# Patient Record
Sex: Male | Born: 1960 | Hispanic: No | Smoking: Never smoker
Health system: Southern US, Community
[De-identification: ages and names within clinical notes are randomized; demographics above are authoritative.]

## PROBLEM LIST (undated history)

## (undated) DIAGNOSIS — G2581 Restless legs syndrome: Secondary | ICD-10-CM

## (undated) DIAGNOSIS — Z973 Presence of spectacles and contact lenses: Secondary | ICD-10-CM

## (undated) DIAGNOSIS — H9312 Tinnitus, left ear: Secondary | ICD-10-CM

## (undated) DIAGNOSIS — G4730 Sleep apnea, unspecified: Secondary | ICD-10-CM

## (undated) HISTORY — PX: COLONOSCOPY: SHX174

## (undated) HISTORY — PX: SHOULDER ARTHROSCOPY: SHX128

## (undated) HISTORY — PX: FEMUR SURGERY: SHX943

---

## 2001-08-08 ENCOUNTER — Ambulatory Visit (HOSPITAL_COMMUNITY): Admission: RE | Admit: 2001-08-08 | Discharge: 2001-08-08 | Payer: Self-pay | Admitting: Endocrinology

## 2001-08-08 ENCOUNTER — Encounter: Payer: Self-pay | Admitting: Endocrinology

## 2006-02-17 ENCOUNTER — Ambulatory Visit (HOSPITAL_COMMUNITY): Admission: RE | Admit: 2006-02-17 | Discharge: 2006-02-17 | Payer: Self-pay | Admitting: Orthopedic Surgery

## 2009-05-31 ENCOUNTER — Emergency Department (HOSPITAL_COMMUNITY): Admission: EM | Admit: 2009-05-31 | Discharge: 2009-05-31 | Payer: Self-pay | Admitting: Emergency Medicine

## 2011-02-14 LAB — BASIC METABOLIC PANEL
BUN: 10 mg/dL (ref 6–23)
CO2: 29 mEq/L (ref 19–32)
GFR calc non Af Amer: >60 mL/min (ref >60)
Glucose, Bld: 100 mg/dL — ABNORMAL HIGH (ref 70–99)
Potassium: 3.4 mEq/L — ABNORMAL LOW (ref 3.5–5.1)

## 2011-02-14 LAB — CBC
HCT: 44.4 % (ref 39.0–52.0)
Hemoglobin: 14.7 g/dL (ref 13.0–17.0)
MCHC: 33 g/dL (ref 30.0–36.0)
MCV: 94.3 fL (ref 78.0–100.0)
Platelets: 204 10*3/uL (ref 150–400)
RDW: 13.4 % (ref 11.5–15.5)

## 2011-02-14 LAB — DIFFERENTIAL
Eosinophils Absolute: 0.1 10*3/uL (ref 0.0–0.7)
Eosinophils Relative: 2 % (ref 0–5)
Lymphocytes Relative: 22 % (ref 12–46)
Lymphs Abs: 1.2 10*3/uL (ref 0.7–4.0)
Monocytes Absolute: 0.5 10*3/uL (ref 0.1–1.0)
Monocytes Relative: 9 % (ref 3–12)

## 2011-03-26 NOTE — Op Note (Signed)
NAMEHARVARD, Clifford Armstrong             ACCOUNT NO.:  [id number]   MEDICAL RECORD NO.:  [id number]          PATIENT TYPE:  OIB   LOCATION:  0098                         FACILITY:  [facility]   PHYSICIAN:  Marlowe Kays, M.D.  DATE OF BIRTH:  1961/09/02   DATE OF PROCEDURE:  02/17/2006  DATE OF DISCHARGE:                                 OPERATIVE REPORT   PREOPERATIVE DIAGNOSES:  1.  Rotator cuff tendinopathy secondary to chronic impingement syndrome.  2.  Degenerative arthritis acromioclavicular joint left shoulder.   POSTOPERATIVE DIAGNOSES:  1.  Rotator cuff tendinopathy secondary to chronic impingement syndrome.  2.  Degenerative arthritis acromioclavicular joint left shoulder.   OPERATION:  1.  Left shoulder arthroscopy (essentially normal examination).  2.  Arthroscopic subacromial decompression.  3.  Open resection of distal clavicle.   SURGEON:  [place].   ASSISTANT:  Mr. Adrian Blackwater.   ANESTHESIA:  General.   INDICATIONS FOR PROCEDURE:  Chronic pain, left shoulder with no significant  long-term relief from injections.  An MRI has demonstrated the above  diagnoses.  Accordingly because of the pain and lack of ability to respond  to the nonsurgical treatments, he is here for the above surgery.   PROCEDURE:  After satisfactory general anesthesia in beach-chair position on  the Allen frame, left shoulder girdle was prepped with a DuraPrep, draped  into a sterile field.  The anatomy of the shoulder joint was marked out.  The lateral and posterior soft spot portals, subacromial space and the  proposed incision for the distal clavicle excision were all infiltrated with  0.5% Marcaine with adrenalin.  Through a posterior soft spot portal, I  atraumatically entered the glenohumeral joint.  He did have some synovitis  present.  Biceps tendon and rotator cuff looked normal.  There was some  fraying of the labrum, but there did not appear to be any significant  tearing or  anything that required arthroscopic treatment.  Representative  pictures were taken.  I then redirected the scope into the subacromial space  and through a lateral portal introduced a 4.2 shaver, with a large amount of  bursal tissue which I resected to allow visualization.  This was quite  friable.  I then used the ArthroCare vaporizer to cauterize some of the  active blood vessels and also began removing soft tissue from the  undersurface of the acromion.  He was quite tight and even with Mr.  Idolina Primer pulling down on the arm, decompression I had to take in stages.  The next step was to bring in the 4-mm oval bur and then began burring down  bone.  I went back and forth between the vaporizer and the bur until we had  a wide subacromial decompression documented with pictures.  Then removed all  fluid possible from the subacromial space and made an open incision on the  distal clavicle, identifying the [facility] joint and measuring a 1.5 cm proximal to  it where I marked the bone.  I then undermined at that level and placed a  baby Hohmann.  Next, I used a micro saw to amputate the  bone at this point  and removed the cut portion with towel clip and cautery technique.  Bleeders  were coagulated in the resection bed, and after making sure that there were  no remaining bony spicules I placed bone wax over the cut end of the  clavicle, packed the gap with Gelfoam and then closed this wound with  interrupted 0 Vicryl in the fascia, 2-0 Vicryl in subcutaneous tissues and  Steri-Strips on the skin.  We also closed the two portals with 4-0 nylon  followed by Betadine, Adaptic and dry sterile dressings to all wounds  followed by a shoulder immobilizer.  At the time of this dictation, he was  on his way to the recovery room in satisfactory condition with no known  complications.           ______________________________  Marlowe Kays, M.D.     JA/MEDQ  D:  02/17/2006  T:  02/17/2006  Job:   [id number]

## 2013-03-09 ENCOUNTER — Other Ambulatory Visit: Payer: Self-pay | Admitting: Endocrinology

## 2013-03-09 DIAGNOSIS — R109 Unspecified abdominal pain: Secondary | ICD-10-CM

## 2013-03-13 ENCOUNTER — Ambulatory Visit
Admission: RE | Admit: 2013-03-13 | Discharge: 2013-03-13 | Disposition: A | Discharge status: Home / Self Care | Payer: BC Managed Care – PPO | Source: Ambulatory Visit | Attending: Endocrinology | Admitting: Endocrinology

## 2013-03-13 DIAGNOSIS — R109 Unspecified abdominal pain: Secondary | ICD-10-CM

## 2013-03-13 MED ORDER — IOHEXOL 300 MG/ML  SOLN
100.0000 mL | Freq: Once | INTRAMUSCULAR | Status: AC | PRN
  Administered 2013-03-13: 100 mL via INTRAVENOUS

## 2016-09-06 DIAGNOSIS — H903 Sensorineural hearing loss, bilateral: Secondary | ICD-10-CM | POA: Insufficient documentation

## 2016-09-06 DIAGNOSIS — H9312 Tinnitus, left ear: Secondary | ICD-10-CM | POA: Insufficient documentation

## 2016-09-07 ENCOUNTER — Ambulatory Visit
Admission: RE | Admit: 2016-09-07 | Discharge: 2016-09-07 | Disposition: A | Discharge status: Home / Self Care | Payer: 59 | Source: Ambulatory Visit | Attending: Orthopedic Surgery | Admitting: Orthopedic Surgery

## 2016-09-07 ENCOUNTER — Other Ambulatory Visit: Payer: Self-pay | Admitting: Orthopedic Surgery

## 2016-09-07 DIAGNOSIS — S62212A Bennett's fracture, left hand, initial encounter for closed fracture: Secondary | ICD-10-CM

## 2016-09-08 ENCOUNTER — Other Ambulatory Visit: Payer: Self-pay | Admitting: Orthopedic Surgery

## 2020-11-13 NOTE — Progress Notes (Signed)
Subjective:    I'm seeing this patient as a consultation for:  Dr. Renne Crigler. Note will be routed back to referring provider/PCP.  CC: R-sided back pain  I, Christoper Fabian, LAT, ATC, am serving as scribe for Dr. Clementeen Graham.  HPI: Pt is a 60 y/o male presenting w/ c/o R-sided thoracic back pain x approximately one week that began after coughing for a few weeks.  Last Wednesday he started to have increased pain noted w/ coughing and while trying to work out.  He locates his pain specifically to the R side of his T-spine.  He went to Urgent Care on Sunday and was prescribed prednisone and baclofen  Radiating pain: No Aggravating factors: coughing; trunk flexion unsupported; raising R arm above shoulder level; transitioning from sit-to-stand; walking upstairs Treatments tried: Prednisone; baclofen; hydrocodone; massage  Diagnostic imaging: T-spine XR- 11/13/20  Past medical history, Surgical history, Family history, Social history, Allergies, and medications have been entered into the medical record, reviewed.   Review of Systems: No new headache, visual changes, nausea, vomiting, diarrhea, constipation, dizziness, abdominal pain, skin rash, fevers, chills, night sweats, weight loss, swollen lymph nodes, body aches, joint swelling, muscle aches, chest pain, shortness of breath, mood changes, visual or auditory hallucinations.   Objective:    Vitals:   11/14/20 0912  BP: (!) 148/90  Pulse: 83  SpO2: 98%   General: Well Developed, well nourished, and in no acute distress.  Neuro/Psych: Alert and oriented x3, extra-ocular muscles intact, able to move all 4 extremities, sensation grossly intact. Skin: Warm and dry, no rashes noted.  Respiratory: Not using accessory muscles, speaking in full sentences, trachea midline.  Lungs clear to auscultation bilaterally chest expansion symmetrical. Cardiovascular: Pulses palpable, no extremity edema. Abdomen: Does not appear distended. MSK: T-spine  normal-appearing nontender midline. Not particularly tender to palpation perispinal musculature. Scapular motion right-sided slight delayed compared to left. Arm motion intact bilaterally.   Lab and Radiology Results   EXAM: THORACIC SPINE 2 VIEWS  COMPARISON: None.  FINDINGS: Evaluation of the superior aspect of the thoracic spine as well as the cervicothoracic junction is degraded secondary overlying osseous and soft tissue structures.  Normal alignment of the thoracic spine. No significant scoliotic curvature. No anterolisthesis or retrolisthesis.  Thoracic vertebral body heights appear preserved.  Thoracic intervertebral disc space heights appear preserved. 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  Limited visualization of the adjacent thorax demonstrates mild tortuosity of the thoracic aorta, potentially accentuated due to technique. Regional soft tissues appear normal  IMPRESSION: 1. No explanation for patient's right-sided thoracic spine pain. 2. Stigmata of dish within midthoracic spine.   Electronically Signed By: Simonne Come M.D. On: 11/13/2020 16:07  I, Clementeen Graham, personally (independently) visualized and performed the interpretation of the images attached in this note.  Chest x-ray images obtained today personally and independently interpreted Normal 2 view chest x-ray.  Mild T-spine degenerative changes as noted above.  No infiltrates. Await formal radiology review  Impression and Recommendations:    Assessment and Plan: 60 y.o. male with right-sided thoracic back pain/chest pain.  Pain occurred with cough.  Could represent intercostal muscle spasm and dysfunction or periscapular spasm and dysfunction.  Also could be pleuritis or even rib subluxation.  Patient has not improved with 2 courses of prednisone and baclofen.  I do not think more prednisone is going to help here.  We will try switching of the muscle relaxer to tizanidine although I am not  very optimistic about that.  Will refer to physical therapy which ultimately think will be the most helpful treatment.  He may have some benefit with chiropractic care as well.  Recommend also heating pad and TENS unit.  If cough returns can prescribe Tessalon Perles or even opiates.  If not improving or if worsening could consider CT scan of the chest.  Check back as needed.Marland Kitchen  PDMP not reviewed this encounter. Orders Placed This Encounter  Procedures  . DG Chest 2 View    Standing Status:   Future    Number of Occurrences:   1    Standing Expiration Date:   11/14/2021    Order Specific Question:   Reason for Exam (SYMPTOM  OR DIAGNOSIS REQUIRED)    Answer:   eval ? pleuritis rt    Order Specific Question:   Preferred imaging location?    Answer:   [facility]  . Ambulatory referral to Physical Therapy    Referral Priority:   Routine    Referral Type:   Physical Medicine    Referral Reason:   Specialty Services Required    Requested Specialty:   Physical Therapy   Meds ordered this encounter  Medications  . tiZANidine (ZANAFLEX) 4 MG tablet    Sig: Take 1 tablet (4 mg total) by mouth every 6 (six) hours as needed for muscle spasms.    Dispense:  30 tablet    Refill:  1    Discussed warning signs or symptoms. Please see discharge instructions. Patient expresses understanding.   The above documentation has been reviewed and is accurate and complete Clementeen Graham, M.D.

## 2020-11-14 ENCOUNTER — Ambulatory Visit (INDEPENDENT_AMBULATORY_CARE_PROVIDER_SITE_OTHER): Payer: 59 | Admitting: Family Medicine

## 2020-11-14 ENCOUNTER — Encounter: Payer: Self-pay | Admitting: Family Medicine

## 2020-11-14 ENCOUNTER — Ambulatory Visit (INDEPENDENT_AMBULATORY_CARE_PROVIDER_SITE_OTHER): Payer: 59

## 2020-11-14 ENCOUNTER — Telehealth: Payer: Self-pay | Admitting: Family Medicine

## 2020-11-14 ENCOUNTER — Other Ambulatory Visit: Payer: Self-pay

## 2020-11-14 VITALS — BP 148/90 | HR 83 | Ht 72.0 in | Wt 160.4 lb

## 2020-11-14 DIAGNOSIS — M546 Pain in thoracic spine: Secondary | ICD-10-CM

## 2020-11-14 DIAGNOSIS — R091 Pleurisy: Secondary | ICD-10-CM

## 2020-11-14 MED ORDER — TIZANIDINE HCL 4 MG PO TABS: 4.0000 mg | ORAL_TABLET | Freq: Four times a day (QID) | ORAL | 1 refills | Status: DC | PRN

## 2020-11-14 NOTE — Patient Instructions (Signed)
Thank you for coming in today.  STOP the prednisone and baclofen.   Try tizanidine muscle relaxer.   Please get an Xray today before you leave  I've referred you to Physical Therapy.  Let us know if you don't hear from them in one week.  I think chiropractor may help here. If you cannot get in quickly let me know.   Heating pad likely will help.    TENS UNIT: This is helpful for muscle pain and spasm.   Search and Purchase a TENS 7000 2nd edition at  www.tenspros.com or www.[employer].com It should be less than $30.     TENS unit instructions: Do not shower or bathe with the unit on . Turn the unit off before removing electrodes or batteries . If the electrodes lose stickiness add a drop of water to the electrodes after they are disconnected from the unit and place on plastic sheet. If you continued to have difficulty, call the TENS unit company to purchase more electrodes. . Do not apply lotion on the skin area prior to use. Make sure the skin is clean and dry as this will help prolong the life of the electrodes. . After use, always check skin for unusual red areas, rash or other skin difficulties. If there are any skin problems, does not apply electrodes to the same area. . Never remove the electrodes from the unit by pulling the wires. . Do not use the TENS unit or electrodes other than as directed. . Do not change electrode placement without consultating your therapist or physician. Marland Kitchen Keep 2 fingers with between each electrode. . Wear time ratio is 2:1, on to off times.    For example on for 30 minutes off for 15 minutes and then on for 30 minutes off for 15 minutes    TENS UNIT: This is helpful for muscle pain and spasm.   Search and Purchase a TENS 7000 2nd edition at  www.tenspros.com or www.[email or web address] It should be less than $30.     TENS unit instructions: Do not shower or bathe with the unit on . Turn the unit off before removing electrodes or batteries . If the  electrodes lose stickiness add a drop of water to the electrodes after they are disconnected from the unit and place on plastic sheet. If you continued to have difficulty, call the TENS unit company to purchase more electrodes. . Do not apply lotion on the skin area prior to use. Make sure the skin is clean and dry as this will help prolong the life of the electrodes. . After use, always check skin for unusual red areas, rash or other skin difficulties. If there are any skin problems, does not apply electrodes to the same area. . Never remove the electrodes from the unit by pulling the wires. . Do not use the TENS unit or electrodes other than as directed. . Do not change electrode placement without consultating your therapist or physician. Marland Kitchen Keep 2 fingers with between each electrode. . Wear time ratio is 2:1, on to off times.    For example on for 30 minutes off for 15 minutes and then on for 30 minutes off for 15 minutes      If not improving let me know. \  Next diagnostic test is probably CT of the chest. I want to avoid this because of radiation and cost.   I think the most likely cause of your pain is an intercostal muscle spasm or strain.  Pleurisy  Pleurisy, also called pleuritis, is irritation and swelling (inflammation) of the linings of the lungs. The linings of the lungs are called pleura. They cover the outside of the lungs and the inside of the chest wall. There is a small amount of fluid (pleural fluid) between the pleura that allows the lungs to move in and out smoothly when you breathe. Pleurisy causes the pleura to be rough and dry and to rub together when you breathe, which is painful. In some cases, pleurisy can cause pleural fluid to build up between the pleura (pleural effusion). What are the causes? Common causes of this condition include:  A lung infection caused by bacteria or a virus.  A blood clot that travels to the lung (pulmonary embolism).  Air leaking  into the pleural space (pneumothorax).  Lung cancer or a lung tumor.  A chest injury.  Diseases that can cause lung inflammation. These include rheumatoid arthritis, lupus, sickle cell disease, inflammatory bowel disease, and pancreatitis.  Heart or chest surgery.  Lung damage from inhaling asbestos.  A lung reaction to certain medicines. Sometimes the cause is unknown. What are the signs or symptoms? Chest pain is the main symptom of this condition. The pain is usually on one side. Chest pain may start suddenly and be sharp or stabbing. It may become a constant dull ache. You may also feel pain in your back or shoulder. The pain may get worse when you cough, take deep breaths, or make sudden movements. Other symptoms may include:  Shortness of breath.  Noisy breathing (wheezing).  Cough.  Chills.  Fever. How is this diagnosed? This condition may be diagnosed based on:  Your medical history.  Your symptoms.  A physical exam. Your health care provider will listen to your breathing with a stethoscope to check for a rough, rubbing sound (friction rub). If you have pleural effusion, your breathing sounds may be muffled.  Tests, such as: ? Blood tests to check for infections or diseases and to measure the oxygen in your blood. ? Imaging studies of your lungs. These may include a chest X-ray, ultrasound, MRI, or CT scan. ? A procedure to remove pleural fluid with a needle for testing (thoracentesis). How is this treated? Treatment for this condition depends on the cause. Pleurisy that was caused by a virus usually clears up within 2 weeks. Treatment for pleurisy may include:  NSAIDs to help relieve pain and swelling.  Antibiotic medicines, if your condition was caused by a bacterial infection.  Prescription pain or cough medicine.  Medicines to dissolve a blood clot, if your condition was caused by pulmonary embolism.  Removal of pleural fluid or air. Follow these  instructions at home: Medicines  Take over-the-counter and prescription medicines only as told by your health care provider.  If you were prescribed an antibiotic, take it as told by your health care provider. Do not stop taking the antibiotic even if you start to feel better. Activity  Rest and return to your normal activities as told by your health care provider. Ask your health care provider what activities are safe for you.  Do not drive or use heavy machinery while taking prescription pain medicine. General instructions   Monitor your pleurisy for any changes.  Take deep breaths often, even if it is painful. This can help prevent lung infection (pneumonia) and collapse of lung tissue (atelectasis).  When lying down, lie on your painful side. This may reduce pain.  Do not smoke. If you need help  quitting, ask your health care provider.  Keep all follow-up visits as told by your health care provider. This is important. Contact a health care provider if:  You have pain that: ? Gets worse. ? Does not get better with medicine. ? Lasts for more than 1 week.  You have a fever or chills.  Your cough or shortness of breath is not improving at home.  You cough up pus-like (purulent) secretions. Get help right away if:  Your lips, fingernails, or toenails darken or turn blue.  You cough up blood.  You have any of the following symptoms that get worse: ? Difficulty breathing. ? Shortness of breath. ? Wheezing.  You have pain that spreads into your neck, arms, or jaw.  You develop a rash.  You vomit.  You faint. Summary  Pleurisy is inflammation of the linings of the lungs (pleura).  Pleurisy causes pain that makes it difficult for you to breathe or cough.  Pleurisy is often caused by an underlying infection or disease.  Treatment of pleurisy depends on the cause, and it often includes medicines. This information is not intended to replace advice given to you by  your health care provider. Make sure you discuss any questions you have with your health care provider. Document Revised: 10/07/2017 Document Reviewed: 07/19/2016 Elsevier Patient Education  2020 [employer].

## 2020-11-14 NOTE — Progress Notes (Signed)
Chest x-ray is normal to radiology

## 2020-11-14 NOTE — Telephone Encounter (Signed)
Chest x-ray results (normal) given to patient, pt expressed understanding and no further questions.

## 2020-11-17 ENCOUNTER — Encounter: Payer: Self-pay | Admitting: Family Medicine

## 2020-11-17 NOTE — Telephone Encounter (Signed)
Noted and result note entered.

## 2020-11-20 ENCOUNTER — Ambulatory Visit: Payer: 59 | Admitting: Physical Therapy

## 2020-11-20 ENCOUNTER — Encounter: Payer: Self-pay | Admitting: Physical Therapy

## 2020-11-20 DIAGNOSIS — M546 Pain in thoracic spine: Secondary | ICD-10-CM

## 2020-11-20 NOTE — Patient Instructions (Signed)
Access Code: [email or web address] URL: [email or web address] Date: 11/20/2020 Prepared by: Sedalia Muta  Exercises Supine Single Knee to Chest Stretch - 2 x daily - 3 reps - 30 hold Supine Lower Trunk Rotation - 2 x daily - 10 reps - 5 hold Supine Coordinated Shoulder Flexion Reaching to End Range - 2 x daily - 1 sets - 10 reps Supine Shoulder Horizontal Abduction with Dumbbells - 1 x daily - 1 sets - 10 reps

## 2020-11-22 ENCOUNTER — Encounter: Payer: Self-pay | Admitting: Physical Therapy

## 2020-11-22 NOTE — Therapy (Signed)
[facility] [address] [place], [place], [address] Phone: [phone]   Fax:  [phone]  Physical Therapy Evaluation  Patient Details  Name: Clifford Armstrong MRN: [id number] Date of Birth: 11/02/1961 Referring Provider (PT): Clementeen Graham   Encounter Date: 11/20/2020   PT End of Session - 11/22/20 1503    Visit Number 2    Number of Visits 12    Date for PT Re-Evaluation 01/01/21    Authorization Type UHC    PT Start Time 0845    PT Stop Time 0928    PT Time Calculation (min) 43 min    Activity Tolerance Patient tolerated treatment well    Behavior During Therapy [facility] for tasks assessed/performed           History reviewed. No pertinent past medical history.  History reviewed. No pertinent surgical history.  There were no vitals filed for this visit.    Subjective Assessment - 11/22/20 1501    Subjective Pt notes increased thoracic pain about 2 weeks ago, when he was coughing. Did have severe pain, is a bit better now. Did also see chiropractor last week. Most pain in R mid thoracic, lateral rib region. Exercise: normally cycle, gym/weights. Works on [employer].    Limitations Lifting;Standing;House hold activities    Patient Stated Goals decreased pain    Currently in Pain? Yes    Pain Score 6     Pain Location Back    Pain Orientation Right    Pain Descriptors / Indicators Sharp;Aching    Pain Type Acute pain    Pain Onset More than a month ago    Pain Frequency Intermittent    Aggravating Factors  leaning forward, sit to stand, transfers ,quick movements, sneezing/couging.              [facility] PT Assessment - 11/22/20 0001      Assessment   Medical Diagnosis Thoracic pain    Referring Provider (PT) Clementeen Graham    Prior Therapy no      Balance Screen   Has the patient fallen in the past 6 months No      Cognition   Overall Cognitive Status Within Functional Limits for tasks assessed      ROM / Strength    AROM / PROM / Strength AROM;Strength      AROM   Overall AROM Comments Shoulder: WNL/ pain in back with full elevation;  Thoracic/Lumbar: ext: wnl, R SB pain and mod limitation;  L SB painand mild limitation;  Flexion: significant limitation / pain;  R Ext and rotation: pain;      Strength   Overall Strength Comments Hips: 4+/5; Shoulders: 5/5/      Palpation   Palpation comment Pain in mid thoracic region with palpation of ribs on R;  Central thoracic spine non tender with PAs, minimal trigger points or soreness in rhomboids;      Special Tests   Other special tests Pain with Transfers, sit to stand; Mild pain with deep breaths, Pain with sneezing                      Objective measurements completed on examination: See above findings.       [facility] Adult PT Treatment/Exercise - 11/22/20 0001      Exercises   Exercises Lumbar      Lumbar Exercises: Stretches   Single Knee to Chest Stretch 3 reps;30 seconds    Lower Trunk Rotation  5 reps;10 seconds    Other Lumbar Stretch Exercise Supine: shoulder fleixon with cane x 15;      Lumbar Exercises: Supine   Other Supine Lumbar Exercises Supine: shoulder horiz abd x 10;                  PT Education - 11/22/20 1503    Education Details PT POC, Exam findings, HEP    Person(s) Educated Patient    Methods Explanation;Demonstration;Tactile cues;Verbal cues;Handout    Comprehension Verbalized understanding;Returned demonstration;Verbal cues required;Tactile cues required;Need further instruction            PT Short Term Goals - 11/22/20 1504      PT SHORT TERM GOAL #1   Title Pt to be independent with initial HEP    Time 2    Period Weeks    Status New    Target Date 12/04/20      PT SHORT TERM GOAL #2   Title Pt to report decreased pain in R rib/thorcic region to 4/10 with activity and spine movment. 2    Time 2    Period Weeks    Status New    Target Date 12/04/20             PT Long Term  Goals - 11/22/20 1505      PT LONG TERM GOAL #1   Title Pt to be independent with final HEP    Time 6    Period Weeks    Status New    Target Date 01/01/21      PT LONG TERM GOAL #2   Title Pt to demo full shoulder ROM and lumbar/thoracic ROM without pain , to improve ability for ADLS.    Time 6    Period Weeks    Status New    Target Date 01/01/21      PT LONG TERM GOAL #3   Title Pt to demo ability for at least 30 min of exercise without pain greater than 2/10    Time 6    Period Weeks    Status New    Target Date 01/01/21                  Plan - 11/22/20 1504    Clinical Impression Statement Pt presents with primary complaint of increased pain in R thoracic region. Pt with tenderness in mid/lower, lateral ribs on R with palpation and mobilization. Minimal soreness in surrounding musculature. Most increased pain with thoracic/lumbar flexion ROM, or with increased pressure, sneezing, coughing. Pt with limitations for functional activities due to signficant pain. Pt to benefit from skilled PT to improve deficits and pain.    Examination-Activity Limitations Reach Overhead;Transfers;Bend;Sleep;Squat;Stairs;Lift;Dressing    Examination-Participation Restrictions Cleaning;Community Activity;Driving;Shop;Laundry;Yard Work    Stability/Clinical Decision Making Stable/Uncomplicated    Optometrist Low    Rehab Potential Good    PT Frequency 2x / week    PT Duration 6 weeks    PT Treatment/Interventions ADLs/Self Care Home Management;Cryotherapy;Canalith Repostioning;Electrical Stimulation;Iontophoresis 4mg /ml Dexamethasone;Moist Heat;Traction;Ultrasound;Parrafin;DME Instruction;Balance training;Therapeutic exercise;Therapeutic activities;Functional mobility training;Stair training;Gait training;Neuromuscular re-education;Patient/family education;Cognitive remediation;Passive range of motion;Manual techniques;Dry needling;Taping;Vasopneumatic Device;Spinal  Manipulations;Joint Manipulations    PT Home Exercise Plan [email or web address]    Consulted and Agree with Plan of Care Patient           Patient will benefit from skilled therapeutic intervention in order to improve the following deficits and impairments:  Decreased range of motion,Increased muscle spasms,Decreased activity tolerance,Pain,Impaired flexibility,Decreased mobility,Decreased strength  Visit Diagnosis: Pain in thoracic spine     Problem List Patient Active Problem List   Diagnosis Date Noted  . Sensorineural hearing loss (SNHL), bilateral 09/06/2016  . Tinnitus of left ear 09/06/2016   Sedalia Muta, PT, DPT 3:05 PM  11/22/20    [facility] [address] [place], [place], [address] Phone: [phone]   Fax:  [phone]  Name: Clifford Armstrong MRN: [id number] Date of Birth: May 29, 1961

## 2020-11-24 ENCOUNTER — Encounter: Payer: 59 | Admitting: Physical Therapy

## 2020-11-27 ENCOUNTER — Encounter: Payer: Self-pay | Admitting: Physical Therapy

## 2020-11-27 ENCOUNTER — Ambulatory Visit: Payer: 59 | Admitting: Physical Therapy

## 2020-11-27 ENCOUNTER — Other Ambulatory Visit: Payer: Self-pay

## 2020-11-27 DIAGNOSIS — M546 Pain in thoracic spine: Secondary | ICD-10-CM | POA: Diagnosis not present

## 2020-11-27 NOTE — Therapy (Signed)
[facility] [address] [place], [place], [address] Phone: [phone]   Fax:  [phone]  Physical Therapy Treatment  Patient Details  Name: Clifford Armstrong MRN: [id number] Date of Birth: 04-01-1961 Referring Provider (PT): Clementeen Graham   Encounter Date: 11/27/2020   PT End of Session - 11/27/20 1316    Visit Number 3    Number of Visits 12    Date for PT Re-Evaluation 01/01/21    Authorization Type UHC    PT Start Time 0844    PT Stop Time 0929    PT Time Calculation (min) 45 min    Activity Tolerance Patient tolerated treatment well    Behavior During Therapy [facility] for tasks assessed/performed           History reviewed. No pertinent past medical history.  History reviewed. No pertinent surgical history.  There were no vitals filed for this visit.   Subjective Assessment - 11/27/20 1316    Subjective Pt states slowly improving pain  since last visit/last week.    Currently in Pain? Yes    Pain Score 4     Pain Location Back    Pain Orientation Right    Pain Descriptors / Indicators Aching    Pain Type Acute pain    Pain Onset More than a month ago    Pain Frequency Intermittent                             [facility] Adult PT Treatment/Exercise - 11/27/20 0001      Exercises   Exercises Lumbar      Lumbar Exercises: Stretches   Single Knee to Chest Stretch 3 reps;30 seconds    Lower Trunk Rotation 5 reps;10 seconds    Other Lumbar Stretch Exercise Supine: shoulder fleixon x 15;      Lumbar Exercises: Standing   Functional Squats 15 reps    Row 20 reps    Theraband Level (Row) Level 2 (Red)    Other Standing Lumbar Exercises BIl ER RTB x 15; Horizontal abd RTB x 15;  Wall push ups x 15; Push ups at counter x 15:      Lumbar Exercises: Seated   Sit to Stand 10 reps    Other Seated Lumbar Exercises thoracic rotation/slow x 10      Lumbar Exercises: Supine   Bridge 15 reps    Other Supine  Lumbar Exercises Supine: shoulder horiz abd x 10;      Lumbar Exercises: Quadruped   Other Quadruped Lumbar Exercises High Plank 30 sec x 3;                    PT Short Term Goals - 11/22/20 1504      PT SHORT TERM GOAL #1   Title Pt to be independent with initial HEP    Time 2    Period Weeks    Status New    Target Date 12/04/20      PT SHORT TERM GOAL #2   Title Pt to report decreased pain in R rib/thorcic region to 4/10 with activity and spine movment. 2    Time 2    Period Weeks    Status New    Target Date 12/04/20             PT Long Term Goals - 11/22/20 1505      PT LONG TERM GOAL #1  Title Pt to be independent with final HEP    Time 6    Period Weeks    Status New    Target Date 01/01/21      PT LONG TERM GOAL #2   Title Pt to demo full shoulder ROM and lumbar/thoracic ROM without pain , to improve ability for ADLS.    Time 6    Period Weeks    Status New    Target Date 01/01/21      PT LONG TERM GOAL #3   Title Pt to demo ability for at least 30 min of exercise without pain greater than 2/10    Time 6    Period Weeks    Status New    Target Date 01/01/21                 Plan - 11/27/20 1317    Clinical Impression Statement Pt with much less pain today, less tenderness wtih palpation, and less pain with activities. Pt with improved ability for trunk flexion, as well as hip hinge/flexion motion for transfes and squats. Discussed activities to continue to be cautious with, faster movments, twisting, etc. Plan to progress as tolerated.    Examination-Activity Limitations Reach Overhead;Transfers;Bend;Sleep;Squat;Stairs;Lift;Dressing    Examination-Participation Restrictions Cleaning;Community Activity;Driving;Shop;Laundry;Yard Work    Stability/Clinical Decision Making Stable/Uncomplicated    Rehab Potential Good    PT Frequency 2x / week    PT Duration 6 weeks    PT Treatment/Interventions ADLs/Self Care Home  Management;Cryotherapy;Canalith Repostioning;Electrical Stimulation;Iontophoresis 4mg /ml Dexamethasone;Moist Heat;Traction;Ultrasound;Parrafin;DME Instruction;Balance training;Therapeutic exercise;Therapeutic activities;Functional mobility training;Stair training;Gait training;Neuromuscular re-education;Patient/family education;Cognitive remediation;Passive range of motion;Manual techniques;Dry needling;Taping;Vasopneumatic Device;Spinal Manipulations;Joint Manipulations    PT Home Exercise Plan [email or web address]    Consulted and Agree with Plan of Care Patient           Patient will benefit from skilled therapeutic intervention in order to improve the following deficits and impairments:  Decreased range of motion,Increased muscle spasms,Decreased activity tolerance,Pain,Impaired flexibility,Decreased mobility,Decreased strength  Visit Diagnosis: Pain in thoracic spine     Problem List Patient Active Problem List   Diagnosis Date Noted  . Sensorineural hearing loss (SNHL), bilateral 09/06/2016  . Tinnitus of left ear 09/06/2016    09/08/2016, PT, DPT 1:24 PM  11/27/20    [facility] [address] [place], [place], [place] Phone: [phone]   Fax:  [phone]  Name: Clifford Armstrong MRN: Daniel Nones Date of Birth: 1961/03/06

## 2020-12-10 ENCOUNTER — Ambulatory Visit: Payer: 59 | Admitting: Physical Therapy

## 2020-12-10 ENCOUNTER — Encounter: Payer: Self-pay | Admitting: Physical Therapy

## 2020-12-10 ENCOUNTER — Other Ambulatory Visit: Payer: Self-pay

## 2020-12-10 DIAGNOSIS — M546 Pain in thoracic spine: Secondary | ICD-10-CM

## 2020-12-10 NOTE — Therapy (Addendum)
[facility] [address] [place], [place], [address] Phone: [phone]   Fax:  [phone]  Physical Therapy Treatment  Patient Details  Name: Clifford Armstrong MRN: [id number] Date of Birth: 1961-07-24 Referring Provider (PT): Lynne Leader   Encounter Date: 12/10/2020   PT End of Session - 12/10/20 0931    Visit Number 4    Number of Visits 12    Date for PT Re-Evaluation 01/01/21    Authorization Type UHC    PT Start Time 0850    PT Stop Time 0925    PT Time Calculation (min) 35 min    Activity Tolerance Patient tolerated treatment well    Behavior During Therapy [facility] for tasks assessed/performed           History reviewed. No pertinent past medical history.  History reviewed. No pertinent surgical history.  There were no vitals filed for this visit.   Subjective Assessment - 12/10/20 0930    Subjective Pt was away on vacation. Was able to snowboard, feels his pain is 90% better, only feels mild soreness at times in R mid/lower rib .    Currently in Pain? Yes    Pain Score 1     Pain Location Back    Pain Orientation Right    Pain Descriptors / Indicators Aching    Pain Type Acute pain    Pain Onset More than a month ago    Pain Frequency Intermittent                             [facility] Adult PT Treatment/Exercise - 12/10/20 0849      Exercises   Exercises Lumbar      Lumbar Exercises: Stretches   Single Knee to Chest Stretch --    Lower Trunk Rotation --    Other Lumbar Stretch Exercise --    Other Lumbar Stretch Exercise Doorway stretch 30 sec  3;  Post shoulder stretch 30 sec x 3 on R;      Lumbar Exercises: Aerobic   UBE (Upper Arm Bike) x4 min fwd and bwd      Lumbar Exercises: Standing   Functional Squats --    Row 20 reps    Theraband Level (Row) Level 4 (Blue)    Other Standing Lumbar Exercises BIl ER GTB x 15; Horizontal abd GTB x 15;   Push ups at counter x 20;    Other Standing  Lumbar Exercises Bent over row on table x 10 bil; 10lb;      Lumbar Exercises: Seated   Sit to Stand --    Other Seated Lumbar Exercises Military press 10lb x 20;      Lumbar Exercises: Supine   Bridge --    Other Supine Lumbar Exercises --      Lumbar Exercises: Prone   Other Prone Lumbar Exercises Prone T x 15 on R;      Lumbar Exercises: Quadruped   Other Quadruped Lumbar Exercises --      Manual Therapy   Manual Therapy Joint mobilization;Soft tissue mobilization    Joint Mobilization Thoracic pA mobs, over pressure on R mid/lower ribs    Soft tissue mobilization DTM and TPR to R rhomboid and med scap border                  PT Education - 12/10/20 0931    Education Details Reviewed Final HEP  Person(s) Educated Patient    Methods Explanation;Demonstration;Tactile cues;Verbal cues    Comprehension Verbalized understanding;Returned demonstration;Verbal cues required;Tactile cues required            PT Short Term Goals - 12/10/20 0933      PT SHORT TERM GOAL #1   Title Pt to be independent with initial HEP    Time 2    Period Weeks    Status Achieved    Target Date 12/04/20      PT SHORT TERM GOAL #2   Title Pt to report decreased pain in R rib/thorcic region to 4/10 with activity and spine movment. 2    Time 2    Period Weeks    Status Achieved    Target Date 12/04/20             PT Long Term Goals - 12/10/20 0934      PT LONG TERM GOAL #1   Title Pt to be independent with final HEP    Time 6    Period Weeks    Status Achieved      PT LONG TERM GOAL #2   Title Pt to demo full shoulder ROM and lumbar/thoracic ROM without pain , to improve ability for ADLS.    Time 6    Period Weeks    Status Achieved      PT LONG TERM GOAL #3   Title Pt to demo ability for at least 30 min of exercise without pain greater than 2/10    Time 6    Period Weeks    Status Partially Met                 Plan - 12/10/20 0932    Clinical Impression  Statement Pt has made good progress. He has only mild soreness at times in R low thoracic rib posteriorly. Pt has been able to return to most regular activities, and has demonstrated good ability for strengthening exercise and thoracic ROM today. Pt ready for d/c to HEP. Reviewed final HEP and safe progression to exercise today. Will only return if he has increased symptoms in next 2 weeks, otherwise will D/C. Pt in agreement wiht plan. Also discussed return to MD as needed or if pain returns.    Examination-Activity Limitations Reach Overhead;Transfers;Bend;Sleep;Squat;Stairs;Lift;Dressing    Examination-Participation Restrictions Cleaning;Community Activity;Driving;Shop;Laundry;Yard Work    Stability/Clinical Decision Making Stable/Uncomplicated    Rehab Potential Good    PT Frequency 2x / week    PT Duration 6 weeks    PT Treatment/Interventions ADLs/Self Care Home Management;Cryotherapy;Canalith Repostioning;Electrical Stimulation;Iontophoresis 75m/ml Dexamethasone;Moist Heat;Traction;Ultrasound;Parrafin;DME Instruction;Balance training;Therapeutic exercise;Therapeutic activities;Functional mobility training;Stair training;Gait training;Neuromuscular re-education;Patient/family education;Cognitive remediation;Passive range of motion;Manual techniques;Dry needling;Taping;Vasopneumatic Device;Spinal Manipulations;Joint Manipulations    PT Home Exercise Plan [email or web address]    Consulted and Agree with Plan of Care Patient           Patient will benefit from skilled therapeutic intervention in order to improve the following deficits and impairments:  Decreased range of motion,Increased muscle spasms,Decreased activity tolerance,Pain,Impaired flexibility,Decreased mobility,Decreased strength  Visit Diagnosis: Pain in thoracic spine     Problem List Patient Active Problem List   Diagnosis Date Noted  . Sensorineural hearing loss (SNHL), bilateral 09/06/2016  . Tinnitus of left ear 09/06/2016     LLyndee Hensen PT, DPT 12:26 PM  12/10/20    [place] [address]Phone: [phone]  Fax:  [phone] Name: Clifford FIALLOSMRN: [id number]  Date of Birth: [birth year]   PHYSICAL THERAPY DISCHARGE SUMMARY  Visits from Start of Care: 4 Plan: Patient agrees to discharge.  Patient goals were met. Patient is being discharged due to meeting the stated rehab goals.  ?????      Lyndee Hensen, PT, DPT 1:22 PM  03/17/21

## 2020-12-10 NOTE — Patient Instructions (Signed)
Access Code: [email or web address] URL: [email or web address] Date: 12/10/2020 Prepared by: Sedalia Muta  Exercises Supine Single Knee to Chest Stretch - 2 x daily - 3 reps - 30 hold Supine Lower Trunk Rotation - 2 x daily - 10 reps - 5 hold Supine Coordinated Shoulder Flexion Reaching to End Range - 2 x daily - 1 sets - 10 reps Supine Shoulder Horizontal Abduction with Dumbbells - 1 x daily - 1 sets - 10 reps Standing Row with Anchored Resistance - 1 x daily - 2 sets - 10 reps Standing Shoulder Horizontal Abduction with Resistance - 1 x daily - 2 sets - 10 reps Shoulder External Rotation and Scapular Retraction with Resistance - 1 x daily - 2 sets - 10 reps

## 2021-12-02 ENCOUNTER — Other Ambulatory Visit: Payer: Self-pay | Admitting: Internal Medicine

## 2021-12-02 DIAGNOSIS — R002 Palpitations: Secondary | ICD-10-CM

## 2021-12-30 ENCOUNTER — Ambulatory Visit
Admission: RE | Admit: 2021-12-30 | Discharge: 2021-12-30 | Disposition: A | Discharge status: Home / Self Care | Payer: No Typology Code available for payment source | Source: Ambulatory Visit | Attending: Internal Medicine | Admitting: Internal Medicine

## 2021-12-30 DIAGNOSIS — R002 Palpitations: Secondary | ICD-10-CM

## 2022-01-20 ENCOUNTER — Institutional Professional Consult (permissible substitution): Payer: 59 | Admitting: Pulmonary Disease

## 2022-02-03 ENCOUNTER — Ambulatory Visit (INDEPENDENT_AMBULATORY_CARE_PROVIDER_SITE_OTHER): Payer: 59 | Admitting: Pulmonary Disease

## 2022-02-03 ENCOUNTER — Other Ambulatory Visit: Payer: Self-pay

## 2022-02-03 ENCOUNTER — Encounter: Payer: Self-pay | Admitting: Pulmonary Disease

## 2022-02-03 VITALS — BP 130/82 | HR 64 | Temp 98.2°F | Ht 72.0 in | Wt 171.6 lb

## 2022-02-03 DIAGNOSIS — R0683 Snoring: Secondary | ICD-10-CM

## 2022-02-03 DIAGNOSIS — G4710 Hypersomnia, unspecified: Secondary | ICD-10-CM | POA: Diagnosis not present

## 2022-02-03 DIAGNOSIS — G4733 Obstructive sleep apnea (adult) (pediatric): Secondary | ICD-10-CM | POA: Diagnosis not present

## 2022-02-03 NOTE — Progress Notes (Signed)
? ?Subjective:  ? ? Patient ID: Clifford Armstrong, male    DOB: 1961-03-23, 61 y.o.   MRN: [id number] ? ?HPI ? 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 ? ?His main complaint is excessive daytime somnolence.  Epworth sleepiness score is 19 and he reports sleepiness while sitting and reading, watching TV, as a passenger in a car lying down to rest in the afternoons or sitting quietly after lunch. ?OSA was diagnosed 15 years ago , he feels this was because he was made to sleep on his back during the study.  He was placed on CPAP with nasal pillows which he used for about a year but could not continue and abandoned therapy.  Loud snoring has been noted by family members, he reports occasional choking episodes that wake him up from sleep. ? ?Bedtime is between 11 PM and midnight, sleep latency is minimal, he sleeps on his side with 1 pillow, reports 1-2 nocturnal awakenings and is out of bed by 8 AM with an alarm, initially feeling rested but gets drowsy again as the day goes on, no headaches or dryness of mouth. ?He will take naps on weekends for about 30 minutes and naps are refreshing. ?He admits to excessive caffeine intake and drinks multiple cups of coffee and 1-2 red bowls daily. ? ?He was recently started on armodafinil 200 mg in January and he is taking half a tablet with good improvement in his daytime somnolence. ?He was also started on Requip for RLS , he has developed a rectal bleed that is getting evaluated by GI ? ?History reviewed. No pertinent past medical history. ? ?No Known Allergies ? ?Social History  ? ?Socioeconomic History  ? Marital status: Single  ?  Spouse name: Not on file  ? Number of children: Not on file  ? Years of education: Not on file  ? Highest education level: Not on file  ?Occupational History  ? Not on file  ?Tobacco Use  ? Smoking status: Never  ? Smokeless tobacco: Never  ?Vaping Use  ? Vaping Use: Never used   ?Substance and Sexual Activity  ? Alcohol use: Not on file  ? Drug use: Not on file  ? Sexual activity: Not on file  ?Other Topics Concern  ? Not on file  ?Social History Narrative  ? Not on file  ? ?Social Determinants of Health  ? ?Financial Resource Strain: Not on file  ?Food Insecurity: Not on file  ?Transportation Needs: Not on file  ?Physical Activity: Not on file  ?Stress: Not on file  ?Social Connections: Not on file  ?Intimate Partner Violence: Not on file  ? ?He drinks alcohol 2-3 times per week ?He travels a lot more driving and air travel ? ?History reviewed. No pertinent family history. ?No family history of OSA or early CAD ?Review of Systems ? ?Irregular heartbeat ?Acid heartburn indigestion ? ? ?Constitutional: negative for anorexia, fevers and sweats  ?Eyes: negative for irritation, redness and visual disturbance  ?Ears, nose, mouth, throat, and face: negative for earaches, epistaxis, nasal congestion and sore throat  ?Respiratory: negative for cough, dyspnea on exertion, sputum and wheezing  ?Cardiovascular: negative for chest pain, dyspnea, lower extremity edema, orthopnea ,syncope  ?Gastrointestinal: negative for abdominal pain, constipation, diarrhea, melena, nausea and vomiting  ?Genitourinary:negative for dysuria, frequency and hematuria  ?Hematologic/lymphatic: negative for bleeding, easy bruising and lymphadenopathy  ?Musculoskeletal:negative for arthralgias, muscle weakness and stiff joints  ?Neurological: negative for coordination problems,  gait problems, headaches and weakness  ?Endocrine: negative for diabetic symptoms including polydipsia, polyuria and weight loss ? ?   ?Objective:  ? Physical Exam ? ?Gen. Pleasant, well-nourished, in no distress, normal affect ?ENT - no pallor,icterus, no post nasal drip, long uvula, class II airway ?Neck: No JVD, no thyromegaly, no carotid bruits ?Lungs: no use of accessory muscles, no dullness to percussion, clear without rales or rhonchi   ?Cardiovascular: Rhythm regular, heart sounds  normal, no murmurs or gallops, no peripheral edema ?Abdomen: soft and non-tender, no hepatosplenomegaly, BS normal. ?Musculoskeletal: No deformities, no cyanosis or clubbing ?Neuro:  alert, non focal ? ? ? ?   ?Assessment & Plan:  ? ? ?

## 2022-02-03 NOTE — Patient Instructions (Signed)
X Home sleep test 

## 2022-02-03 NOTE — Assessment & Plan Note (Addendum)
Causes include OSA, idiopathic hypersomnolence and narcolepsy. ?Nap seem to be refreshing and modafinil has helped.  He has excessive caffeine intake which is likely causing nocturnal palpitations. ?He does have some of the red flags for OSA including snoring and choking episodes during sleep.  We will proceed with a home sleep test.  If he does not have significant sleep disordered breathing or only mild, then we will have to consider performing an MSLT in the future ? ? The pathophysiology of obstructive sleep apnea , it's cardiovascular consequences & modes of treatment including CPAP were discused with the patient in detail & they evidenced understanding. ?We also discussed oral appliance and hypoglossal nerve stimulator implant therapy ? ?Meanwhile, he can continue armodafinil 100 mg daily and naps as needed ?

## 2022-03-03 ENCOUNTER — Encounter: Payer: Self-pay | Admitting: Pulmonary Disease

## 2022-03-09 ENCOUNTER — Ambulatory Visit: Payer: 59 | Admitting: Allergy & Immunology

## 2022-03-09 ENCOUNTER — Encounter: Payer: Self-pay | Admitting: Allergy & Immunology

## 2022-03-09 VITALS — BP 118/76 | HR 90 | Temp 99.0°F | Resp 16 | Ht 72.0 in | Wt 169.1 lb

## 2022-03-09 DIAGNOSIS — L111 Transient acantholytic dermatosis [Grover]: Secondary | ICD-10-CM | POA: Diagnosis not present

## 2022-03-09 DIAGNOSIS — K9049 Malabsorption due to intolerance, not elsewhere classified: Secondary | ICD-10-CM

## 2022-03-09 MED ORDER — TRIAMCINOLONE ACETONIDE 0.1 % EX OINT: 1.0000 "application " | TOPICAL_OINTMENT | Freq: Two times a day (BID) | CUTANEOUS | 1 refills | Status: DC

## 2022-03-09 NOTE — Patient Instructions (Signed)
1. Food intolerance ?- Skin testing has terrible positive predictive value, so false positives are a possibility.  ?- Look at the list and circle foods that you might be interested in. 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  ? ?2. Grover's disease ?- I sent in triamcinolone ointment, so pick that up. ?- It probably is more effective than your 61 year old container of triamcinolone!  ? ?3. Follow up in one month  for skin testing. ? ? ?Please inform us of any Emergency Department visits, hospitalizations, or changes in symptoms. Call us before going to the ED for breathing or allergy symptoms since we might be able to fit you in for a sick visit. Feel free to contact us anytime with any questions, problems, or concerns. ? ?It was a pleasure to meet you today! ? ?Websites that have reliable patient information: ?1. American Academy of Asthma, Allergy, and Immunology: www.aaaai.org ?2. Food Allergy Research and Education (FARE): foodallergy.org ?3. Mothers of Asthmatics: http://www.asthmacommunitynetwork.org ?4. [employer] of Allergy, Asthma, and Immunology: MissingWeapons.ca ? ? ?COVID-19 Vaccine Information can be found at: [email or web address].[email or web address] For questions related to vaccine distribution or appointments, please email [email or web address] or call [phone].  ? ?We realize that you might be concerned about having an allergic reaction to the COVID19 vaccines. To help with that concern, WE ARE OFFERING THE COVID19 VACCINES IN OUR OFFICE! Ask the front desk for dates!  ? ? ? ??Like? [place] on Facebook and Instagram for our latest updates!  ?  ? ? ?A healthy democracy works best when [employer] participate! Make sure you are registered to vote! If you have moved or changed any of your contact information, you will need to get this updated before voting! ? ?In some cases, you MAY be able to register to vote  online: [email or web address] ? ? ? ? ? ? ? ? ? ? ?

## 2022-03-09 NOTE — Progress Notes (Signed)
? ?NEW PATIENT ? ?Date of Service/Encounter:  03/09/22 ? ?Consult requested by: Merri BrunettePharr, Walter, MD ? ? ?Assessment:  ? ?Food intolerance ? ?Grover's disease ? ?Plan/Recommendations:  ? ? ?1. Food intolerance ?- Skin testing has terrible positive predictive value, so false positives are a possibility.  ?- Look at the list and circle foods that you might be interested in. 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  ? ?2. Grover's disease ?- I sent in triamcinolone ointment, so pick that up. ?- It probably is more effective than your 61 year old container of triamcinolone!  ? ?3. Follow up in one month  for skin testing. ? ?This note in its entirety was forwarded to the Provider who requested this consultation. ? ?Subjective:  ? ?Clifford Armstrong is a 61 y.o. male presenting today for evaluation of  ?Chief Complaint  ?Patient presents with  ? Establish Care  ? ? ?Clifford Armstrong has a history of the following: ?Patient Active Problem List  ? Diagnosis Date Noted  ? Hypersomnolence 02/03/2022  ? Sensorineural hearing loss (SNHL), bilateral 09/06/2016  ? Tinnitus of left ear 09/06/2016  ? ? ?History obtained from: chart review and patient. ? ?Clifford Armstrong was referred by Merri BrunettePharr, Walter, MD.    ? ?Clifford Armstrong is a 61 y.o. male presenting for an evaluation of possible food allergies . ? ?He has had stomach issues for years. He reports that he always feels "bad". He had a colonoscopy and endoscopy and nothing was found. He did the FODMAP diet for a while and his stomach was more settled. He then reintroduced each week and nothing bothered his stomach. But then when he started eating normally again, he did not feel good again. He does have some gas and bloating and just feels bad in his upper stomach. This was more of a routine colonoscopy rather than any worsening of the symptoms.  He was noted to have hemorrhoids on the colonoscopy, but otherwise within was normal. ? ?He  did fine during the elimination diet and this did improve his stomach pain.  He is slowly introduce foods back into his diet and noticed no triggering foods at all. ? ?He does not have diarrhea immediate with it. 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 This consists of intermittent break outs. Wynelle LinkSun exposure can trigger it, but largely it is unpredictable. This lasts for a period of one month or so. He triamcinolone for this and does not get systemic sterids for anything in particular. He has had this for back aches.  ?  ?Symptoms are not consistent with a particular food. Tomatoes make his throat itch. He eats them anyway. This is mostly with cooked tomatoes. He has never noticed with spaghetti sauce or anything cooked. Watermelon made his throat itch badly, but he has not eaten in 20+ years. There are stomach issues in the family. Mom and sister have chronic stomach issues. Mom just turned 1587.  ? ?He has no other atopic disease whatsoever. 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 He does not note any kind of trigger for these symptoms at all. He has triamcinolone to use as needed. He typically does not need systemic steroids. Outbreaks last around one month. He has a current flare from the weekend from an unknown trigger.  ? ?Otherwise, there is no history of other atopic diseases,  including asthma, drug allergies, stinging insect allergies, eczema, urticaria, or contact dermatitis. There is no significant infectious history. Vaccinations are up to date.  ? ? ?Past Medical History: ?Patient Active Problem List  ? Diagnosis Date Noted  ? Hypersomnolence 02/03/2022  ? Sensorineural hearing loss (SNHL), bilateral 09/06/2016  ? Tinnitus of left ear 09/06/2016  ? ? ?Medication List:  ?Allergies as of 03/09/2022   ?No Known Allergies ?  ? ?  ?Medication List  ?  ? ?  ? Accurate as of Mar 09, 2022 11:59 PM. If you have any questions, ask your  nurse or doctor.  ?  ?  ? ?  ? ?Armodafinil 200 MG Tabs ?Take 1 tablet by mouth daily. ?  ?OMEPRAZOLE PO ?Take 20 mg by mouth daily. ?  ?rOPINIRole 0.5 MG tablet ?Commonly known as: REQUIP ?Take 0.5 mg by mouth at bedtime. ?  ?rosuvastatin 10 MG tablet ?Commonly known as: CRESTOR ?Take 10 mg by mouth daily. ?  ?triamcinolone ointment 0.1 % ?Commonly known as: KENALOG ?Apply 1 application. topically 2 (two) times daily. ?Started by: Alfonse Spruce, MD ?  ? ?  ? ? ?Birth History: non-contributory ? ?Developmental History: non-contributory ? ?Past Surgical History: ?No past surgical history on file. ? ? ?Family History: ?No family history on file. ? ? ?Social History: Clifford Armstrong lives at home with his family.  He lives in a house that was built in the 1990s.  There is hardware in the main living areas and carpeting in the bedroom.  They have gas heating and central cooling.  There are no animals inside or outside of the home.  There are no dust mite covers on the bedding.  He currently works in [employer].  He has worked for [employer] for 27 years.  There is no HEPA filter.  He is not exposed to fumes, chemicals, or dust. ? ? ?Review of Systems  ?Constitutional: Negative.  Negative for chills, fever, malaise/fatigue and weight loss.  ?HENT: Negative.  Negative for congestion, ear discharge, ear pain and sinus pain.   ?Eyes:  Negative for pain, discharge and redness.  ?Respiratory:  Negative for cough, sputum production, shortness of breath and wheezing.   ?Cardiovascular: Negative.  Negative for chest pain and palpitations.  ?Gastrointestinal:  Positive for abdominal pain and nausea. Negative for constipation, diarrhea, heartburn and vomiting.  ?Skin: Negative.  Negative for itching and rash.  ?Neurological:  Negative for dizziness and headaches.  ?Endo/Heme/Allergies:  Negative for environmental allergies. Does not bruise/bleed easily.   ? ? ? ?Objective:  ? ?Blood pressure 118/76, pulse 90, temperature 99 ?F (37.2 ?C),  resp. rate 16, height 6' (1.829 m), weight 169 lb 2 oz (76.7 kg), SpO2 98 %. ?Body mass index is 22.94 kg/m?. ? ? ? ? ?Physical Exam ?Vitals reviewed.  ?Constitutional:   ?   Appearance: He is well-developed.  ?HENT:  ?   Head: Normocephalic and atraumatic.  ?   Right Ear: Tympanic membrane, ear canal and external ear normal. No drainage, swelling or tenderness. Tympanic membrane is not injected, scarred, erythematous, retracted or bulging.  ?   Left Ear: Tympanic membrane, ear canal and external ear normal. No drainage, swelling or tenderness. Tympanic membrane is not injected, scarred, erythematous, retracted or bulging.  ?   Nose: No nasal deformity, septal deviation, mucosal edema or rhinorrhea.  ?   Right Turbinates: Enlarged, swollen and pale.  ?   Left Turbinates: Enlarged, swollen and pale.  ?   Right Sinus: No  maxillary sinus tenderness or frontal sinus tenderness.  ?   Left Sinus: No maxillary sinus tenderness or frontal sinus tenderness.  ?   Mouth/Throat:  ?   Lips: Pink.  ?   Mouth: Mucous membranes are moist. Mucous membranes are not pale and not dry.  ?   Pharynx: Uvula midline.  ?Eyes:  ?   General: Lids are normal. Allergic shiner present.     ?   Right eye: No discharge.     ?   Left eye: No discharge.  ?   Conjunctiva/sclera: Conjunctivae normal.  ?   Right eye: Right conjunctiva is not injected. No chemosis. ?   Left eye: Left conjunctiva is not injected. No chemosis. ?   Pupils: Pupils are equal, round, and reactive to light.  ?Cardiovascular:  ?   Rate and Rhythm: Normal rate and regular rhythm.  ?   Heart sounds: Normal heart sounds.  ?Pulmonary:  ?   Effort: Pulmonary effort is normal. No tachypnea, accessory muscle usage or respiratory distress.  ?   Breath sounds: Normal breath sounds. No wheezing, rhonchi or rales.  ?Chest:  ?   Chest wall: No tenderness.  ?Abdominal:  ?   Tenderness: There is no abdominal tenderness. There is no guarding or rebound.  ?Lymphadenopathy:  ?   Head:  ?    Right side of head: No submandibular, tonsillar or occipital adenopathy.  ?   Left side of head: No submandibular, tonsillar or occipital adenopathy.  ?   Cervical: No cervical adenopathy.  ?Skin: ?   General:

## 2022-03-10 ENCOUNTER — Encounter: Payer: Self-pay | Admitting: Allergy & Immunology

## 2022-03-10 DIAGNOSIS — L111 Transient acantholytic dermatosis [Grover]: Secondary | ICD-10-CM | POA: Insufficient documentation

## 2022-03-10 DIAGNOSIS — K9049 Malabsorption due to intolerance, not elsewhere classified: Secondary | ICD-10-CM | POA: Insufficient documentation

## 2022-03-11 ENCOUNTER — Telehealth: Payer: Self-pay | Admitting: Allergy & Immunology

## 2022-03-11 MED ORDER — TRIAMCINOLONE ACETONIDE 0.1 % EX CREA: 1.0000 "application " | TOPICAL_CREAM | Freq: Two times a day (BID) | CUTANEOUS | 2 refills | Status: AC

## 2022-03-11 NOTE — Telephone Encounter (Signed)
Patient would like a lotion or cream for his triamcinolone instead of an ointment to be sent in.  ? ?[facility] - [address], [place] [address] . ? ?Best contact [phone] ? ?

## 2022-03-11 NOTE — Telephone Encounter (Signed)
That is fine. I sent it in

## 2022-03-18 ENCOUNTER — Ambulatory Visit: Payer: 59

## 2022-03-22 ENCOUNTER — Ambulatory Visit: Payer: 59

## 2022-03-22 DIAGNOSIS — R0683 Snoring: Secondary | ICD-10-CM | POA: Diagnosis not present

## 2022-03-30 ENCOUNTER — Telehealth: Payer: Self-pay | Admitting: Pulmonary Disease

## 2022-03-30 DIAGNOSIS — G4710 Hypersomnia, unspecified: Secondary | ICD-10-CM

## 2022-03-30 DIAGNOSIS — R0683 Snoring: Secondary | ICD-10-CM | POA: Diagnosis not present

## 2022-03-30 DIAGNOSIS — G4733 Obstructive sleep apnea (adult) (pediatric): Secondary | ICD-10-CM

## 2022-03-30 NOTE — Telephone Encounter (Signed)
HST did not show OSA Only 20 mins of supine sleep was noted during this study , so this may explain hwy this study did not pick up OSA Since his sleepiness is unexplained, would suggest proceed with in lab NPSG followed by nap study (MSLT) to r/o narcolepsy  Can offer OV if he wants to discuss more - either before or after this testing

## 2022-04-02 ENCOUNTER — Ambulatory Visit: Payer: 59 | Admitting: Internal Medicine

## 2022-04-02 NOTE — Telephone Encounter (Signed)
ATC patient to go over results, per DPR left detailed message and advised him to call the office back on Tuesday to let us know how he would like to proceed.

## 2022-04-06 NOTE — Telephone Encounter (Signed)
Called and spoke with patient to make sure he got my message about sleep study results. Asked him if he would like to proceed with additional sleep studies and he said yes. Orders have been placed for both sleep studies. Advised patient that when they call the schedule them ask if OV can be scheduled or to just call office back once he has the date its scheduled for and schedule follow up about 2 weeks after. Patient expressed understanding. Nothing further needed at this time.

## 2022-04-09 IMAGING — DX DG CHEST 2V
2 series · 2 of 2 positions shown · non-contrast
Comparison: 05/31/2009

CLINICAL DATA: Chest pain for several days

EXAM:
CHEST - 2 VIEW

[chest pa]
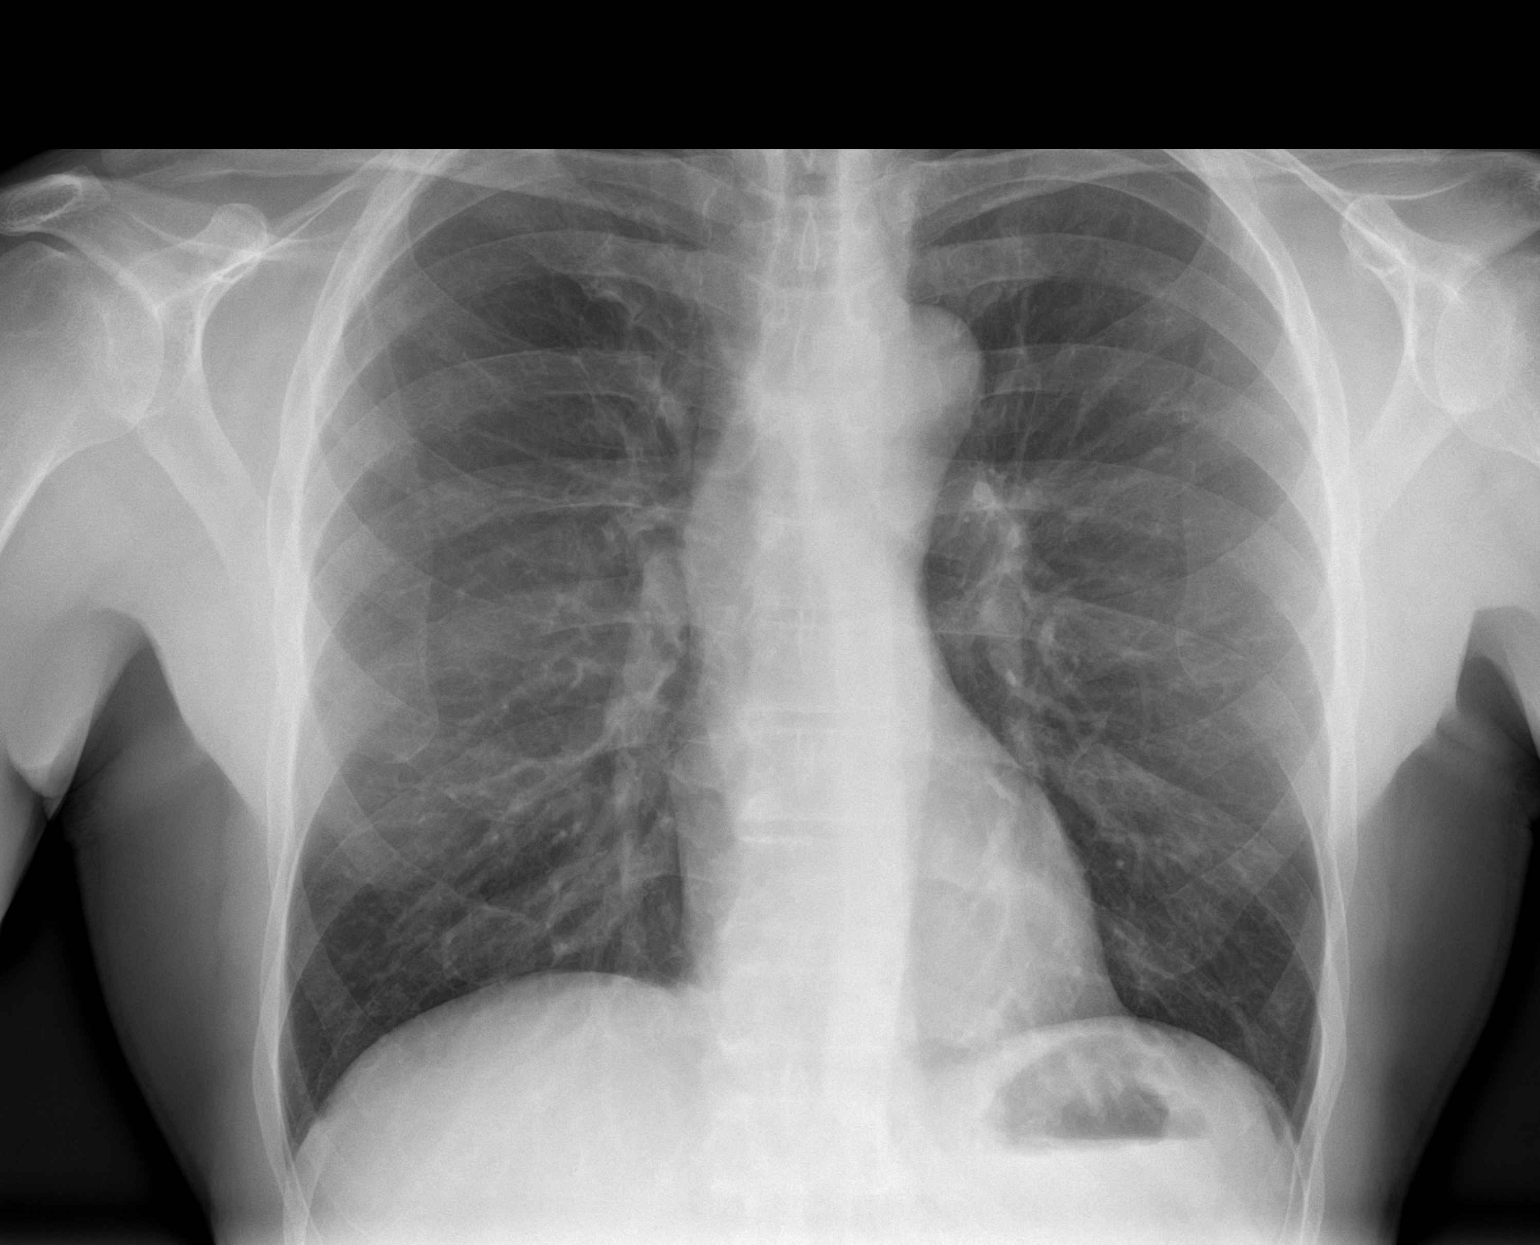

[chest lat]
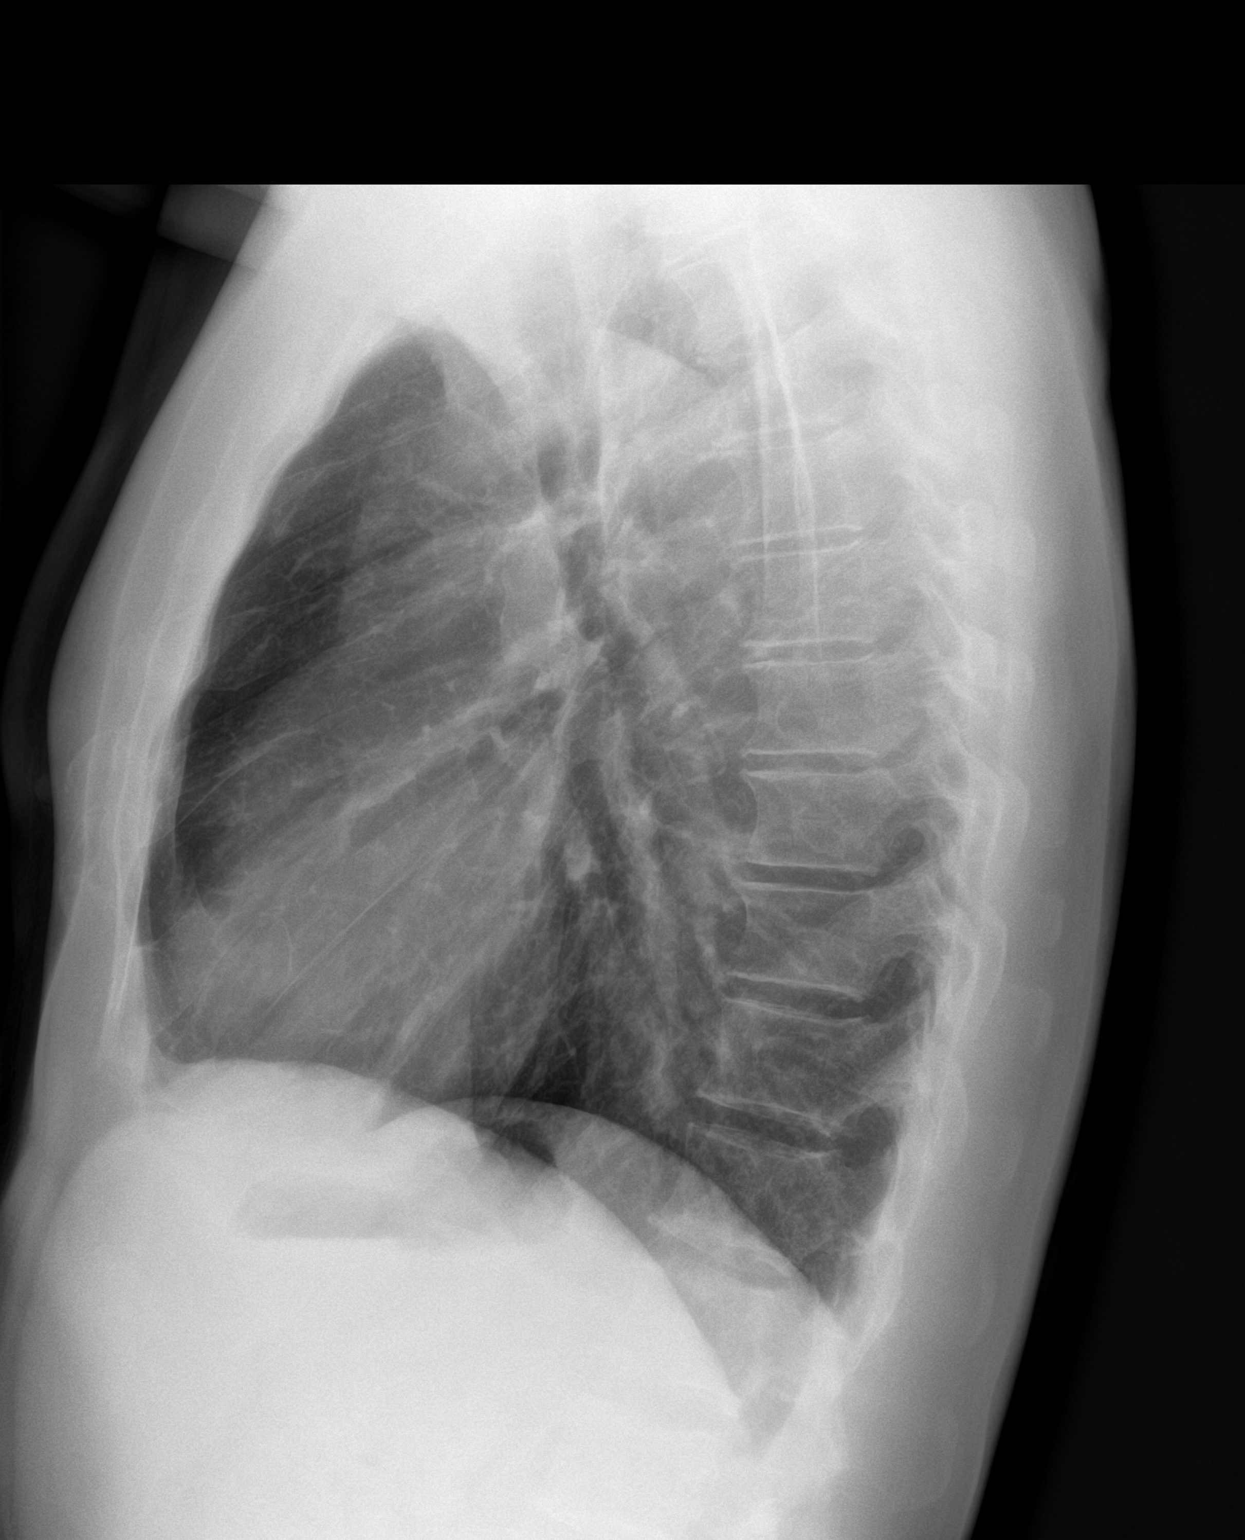

[2 of 2 positions shown; findings below may reference images not displayed]

FINDINGS: Cardiac shadow is within normal limits. The lungs are well aerated
bilaterally. No focal infiltrate or sizable effusion is seen. No
acute bony abnormality is noted.
IMPRESSION: No active cardiopulmonary disease.

## 2022-05-14 ENCOUNTER — Encounter: Payer: Self-pay | Admitting: Internal Medicine

## 2022-05-14 ENCOUNTER — Ambulatory Visit (INDEPENDENT_AMBULATORY_CARE_PROVIDER_SITE_OTHER): Payer: 59 | Admitting: Internal Medicine

## 2022-05-14 VITALS — BP 120/60 | HR 74 | Temp 98.3°F | Resp 12 | Wt 167.6 lb

## 2022-05-14 DIAGNOSIS — G4733 Obstructive sleep apnea (adult) (pediatric): Secondary | ICD-10-CM | POA: Insufficient documentation

## 2022-05-14 DIAGNOSIS — T781XXA Other adverse food reactions, not elsewhere classified, initial encounter: Secondary | ICD-10-CM | POA: Diagnosis not present

## 2022-05-14 DIAGNOSIS — I2510 Atherosclerotic heart disease of native coronary artery without angina pectoris: Secondary | ICD-10-CM | POA: Insufficient documentation

## 2022-05-14 DIAGNOSIS — L111 Transient acantholytic dermatosis [Grover]: Secondary | ICD-10-CM

## 2022-05-14 DIAGNOSIS — T781XXD Other adverse food reactions, not elsewhere classified, subsequent encounter: Secondary | ICD-10-CM

## 2022-05-14 DIAGNOSIS — D509 Iron deficiency anemia, unspecified: Secondary | ICD-10-CM | POA: Insufficient documentation

## 2022-05-14 DIAGNOSIS — D72819 Decreased white blood cell count, unspecified: Secondary | ICD-10-CM | POA: Insufficient documentation

## 2022-05-14 DIAGNOSIS — R002 Palpitations: Secondary | ICD-10-CM | POA: Insufficient documentation

## 2022-05-14 DIAGNOSIS — G2581 Restless legs syndrome: Secondary | ICD-10-CM | POA: Insufficient documentation

## 2022-05-14 DIAGNOSIS — K219 Gastro-esophageal reflux disease without esophagitis: Secondary | ICD-10-CM | POA: Insufficient documentation

## 2022-05-14 DIAGNOSIS — D500 Iron deficiency anemia secondary to blood loss (chronic): Secondary | ICD-10-CM | POA: Insufficient documentation

## 2022-05-14 NOTE — Patient Instructions (Signed)
1. Food intolerance - Skin testing today was positive to peanut and mustard  -Try avoiding these foods for 2 weeks to assess if symptoms improve, if not then reintroduce peanut and mustard  - I do not think you need an epipen based on symptoms as you do not have an IgE mediated reaction to these foods    2. Grover's disease -Continue triamcinolone ointment as needed - Continue to follow up with dermatology   3. Follow up as needed    Please inform us of any Emergency Department visits, hospitalizations, or changes in symptoms. Call us before going to the ED for breathing or allergy symptoms since we might be able to fit you in for a sick visit. Feel free to contact us anytime with any questions, problems, or concerns.  It was a pleasure to meet you today!  Websites that have reliable patient information: 1. American Academy of Asthma, Allergy, and Immunology: www.aaaai.org 2. Food Allergy Research and Education (FARE): foodallergy.org 3. Mothers of Asthmatics: http://www.asthmacommunitynetwork.org 4. American College of Allergy, Asthma, and Immunology: www.acaai.org

## 2022-05-14 NOTE — Progress Notes (Signed)
Follow Up Note  RE: Clifford Armstrong MRN: [id number] DOB: 1961/06/20 Date of Office Visit: 05/14/2022  Referring provider: Merri Brunette, MD Primary care provider: Merri Brunette, MD  Chief Complaint: Allergy Testing (Food mostly, environmental)  History of Present Illness: I had the pleasure of seeing Clifford Armstrong for a follow up visit at the [facility] on 05/14/2022. He is a 61 y.o. male, who is being followed for food intolerance and grover's disease. His previous allergy office visit was on 03/09/22 with Dr. Dellis Anes. Today is a skin testing and follow up visit.  History obtained from patient.  At last visit he reported multiple food intolerances.  He has tried elimination diets, FODMAP diet and has had endoscopy and colonoscopy without diagnosis.  Plan was for food allergy testing.  He denies any immediate type symptoms.  And there is no clear food trigger based on elimination diet.  Testing deferred due to flare of Grovers disease.  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  He is followed by dermatology for these issues.  He treats flare with triamcinolone with good response.  He reports that his guttate psoriasis has resolved would like to do skin testing today.  He was counseled on limitations on IgE mediated food allergy testing for food intolerance and risk of false positives.  Assessment and Plan: Clifford Armstrong is a 61 y.o. male with: Adverse food reaction, subsequent encounter - Plan: Allergy Test  Grover's disease Plan: Patient Instructions  1. Food intolerance - Skin testing today was positive to peanut and mustard  -Try avoiding these foods for 2 weeks to assess if symptoms improve, if not then reintroduce peanut and mustard  - I do not think you need an epipen based on symptoms as you do not have an IgE mediated reaction to these foods    2. Grover's disease -Continue triamcinolone ointment  as needed - Continue to follow up with dermatology   3. Follow up as needed    Please inform us of any Emergency Department visits, hospitalizations, or changes in symptoms. Call us before going to the ED for breathing or allergy symptoms since we might be able to fit you in for a sick visit. Feel free to contact us anytime with any questions, problems, or concerns.  It was a pleasure to meet you today!  Websites that have reliable patient information: 1. American Academy of Asthma, Allergy, and Immunology: www.aaaai.org 2. Food Allergy Research and Education (FARE): foodallergy.org 3. Mothers of Asthmatics: http://www.asthmacommunitynetwork.org 4. American College of Allergy, Asthma, and Immunology: www.acaai.org           No follow-ups on file.  No orders of the defined types were placed in this encounter.   Lab Orders  No laboratory test(s) ordered today   Diagnostics: Skin Testing: Food allergy panel.  Results interpreted by myself during this encounter and discussed with patient/family.  Food Adult Perc - 05/14/22 1400     Time Antigen Placed 1416    Allergen Manufacturer Waynette Buttery    Location Back    Number of allergen test 48     Control-buffer 50% Glycerol Negative    Control-Histamine 1 mg/ml 4+    1. Peanut 3+    2. Soybean Negative    3. Wheat Negative    4. Sesame Negative    5. Milk, cow Negative    6. Egg White, Chicken Negative    8. Shellfish Mix Negative  10. Cashew Negative    11. Pecan Food Negative    12. Walnut Food Negative    13. Almond Negative    16. Coconut Negative    21. Tuna Negative    25. Shrimp Negative    31. Oat  Negative    33. Hops Negative    34. Rice Negative    37. Pork Negative    38. [place] Meat Negative    39. Chicken Meat Negative    40. Beef Negative    42. Tomato Negative    43. White Potato Negative    45. Pea, Green/English Negative    47. Mushrooms Negative    48. Avocado Negative    49. Onion  Negative    50. Cabbage Negative    51. Carrots Negative    52. Celery Negative    53. Corn Negative    54. Cucumber Negative    55. Grape (White seedless) Negative    56. Orange  Negative    57. Banana Negative    58. Apple Negative    59. Peach Negative    60. Strawberry Negative    63. Pineapple Negative    64. Chocolate/Cacao bean Negative    67. Cinnamon Negative    68. Nutmeg Negative    69. Ginger Negative    70. Garlic Negative    71. Pepper, black Negative    72. Mustard 3+             Medication List:  Current Outpatient Medications  Medication Sig Dispense Refill   Armodafinil 200 MG TABS Take 1 tablet by mouth daily.     ferrous sulfate 325 (65 FE) MG tablet 1 tablet     OMEPRAZOLE PO Take 20 mg by mouth daily.     rOPINIRole (REQUIP) 0.5 MG tablet Take 0.5 mg by mouth at bedtime.     rosuvastatin (CRESTOR) 10 MG tablet Take 10 mg by mouth daily.     triamcinolone cream (KENALOG) 0.1 % Apply 1 application. topically 2 (two) times daily. 454 g 2   augmented betamethasone dipropionate (DIPROLENE-AF) 0.05 % cream Apply topically. (Patient not taking: Reported on 05/14/2022)     ciclopirox (PENLAC) 8 % solution SMARTSIG:1 Milliliter(s) Topical Daily (Patient not taking: Reported on 05/14/2022)     desonide (DESOWEN) 0.05 % cream Apply topically. (Patient not taking: Reported on 05/14/2022)     JUBLIA 10 % SOLN Apply 1 Application topically daily. (Patient not taking: Reported on 05/14/2022)     ketoconazole (NIZORAL) 2 % cream Apply 1 Application topically 2 (two) times daily. (Patient not taking: Reported on 05/14/2022)     tamsulosin (FLOMAX) 0.4 MG CAPS capsule Take 0.4 mg by mouth daily. (Patient not taking: Reported on 05/14/2022)     No current facility-administered medications for this visit.   Allergies: No Known Allergies I reviewed his past medical history, social history, family history, and environmental history and no significant changes have been reported from  his previous visit.  ROS: All others negative except as noted per HPI.   Objective: BP 120/60   Pulse 74   Temp 98.3 F (36.8 C) (Temporal)   Resp 12   Wt 167 lb 9.6 oz (76 kg)   SpO2 97%   BMI 22.73 kg/m  Body mass index is 22.73 kg/m. General Appearance:  Alert, cooperative, no distress, appears stated age  Head:  Normocephalic, without obvious abnormality, atraumatic  Eyes:  Conjunctiva clear, EOM's intact  Nose: Nares normal,  Throat: Lips, tongue normal; teeth and gums normal,   Neck: Supple, symmetrical  Lungs:   , Respirations unlabored, no coughing  Heart:  , Appears well perfused  Extremities: No edema  Skin: Skin color, texture, turgor normal, scattered eczematous patches on back  Neurologic: No gross deficits   Previous notes and tests were reviewed. The plan was reviewed with the patient/family, and all questions/concerned were addressed.  It was my pleasure to see Clifford Armstrong today and participate in his care. Please feel free to contact me with any questions or concerns.  Sincerely,  Ferol Luz, MD  Allergy & Immunology  [facility] Office: [phone]

## 2022-05-30 ENCOUNTER — Ambulatory Visit (HOSPITAL_BASED_OUTPATIENT_CLINIC_OR_DEPARTMENT_OTHER): Payer: 59 | Attending: Pulmonary Disease | Admitting: Pulmonary Disease

## 2022-05-30 DIAGNOSIS — G4733 Obstructive sleep apnea (adult) (pediatric): Secondary | ICD-10-CM | POA: Diagnosis present

## 2022-05-30 DIAGNOSIS — G4710 Hypersomnia, unspecified: Secondary | ICD-10-CM | POA: Diagnosis not present

## 2022-05-31 ENCOUNTER — Encounter (HOSPITAL_BASED_OUTPATIENT_CLINIC_OR_DEPARTMENT_OTHER): Payer: 59 | Admitting: Pulmonary Disease

## 2022-06-02 DIAGNOSIS — G4733 Obstructive sleep apnea (adult) (pediatric): Secondary | ICD-10-CM | POA: Diagnosis not present

## 2022-06-02 NOTE — Procedures (Signed)
Patient Name: Clifford Armstrong, Clifford Armstrong Date: 05/30/2022 Gender: Male D.O.B: 10-23-1961 Age (years): 28 Referring Provider: Cyril Mourning MD, ABSM Height (inches): 72 Interpreting Physician: Cyril Mourning MD, ABSM Weight (lbs): 163 RPSGT: Armen Pickup BMI: 22 MRN: [id number] Neck Size: 14.00 <br> <br> CLINICAL INFORMATION Sleep Study Type: NPSG    Indication for sleep study: Snoring, hypersomnolence    Epworth Sleepiness Score: 16    SLEEP STUDY TECHNIQUE As per the AASM Manual for the Scoring of Sleep and Associated Events v2.3 (April 2016) with a hypopnea requiring 4% desaturations.  The channels recorded and monitored were frontal, central and occipital EEG, electrooculogram (EOG), submentalis EMG (chin), nasal and oral airflow, thoracic and abdominal wall motion, anterior tibialis EMG, snore microphone, electrocardiogram, and pulse oximetry.  MEDICATIONS Medications self-administered by patient taken the night of the study : N/A  SLEEP ARCHITECTURE The study was initiated at 10:01:54 PM and ended at 4:31:05 AM.  Sleep onset time was 10.1 minutes and the sleep efficiency was 80.9%%. The total sleep time was 315 minutes.  Stage REM latency was 47.5 minutes.  The patient spent 5.2%% of the night in stage N1 sleep, 59.2%% in stage N2 sleep, 2.7%% in stage N3 and 32.9% in REM.  Alpha intrusion was absent.  Supine sleep was 20.79%.  RESPIRATORY PARAMETERS The overall apnea/hypopnea index (AHI) was 13.7 per hour. There were 65 total apneas, including 65 obstructive, 0 central and 0 mixed apneas. There were 7 hypopneas and 49 RERAs.  The AHI during Stage REM sleep was 9.3 per hour.  AHI while supine was 64.1 per hour.  The mean oxygen saturation was 93.6%. The minimum SpO2 during sleep was 83.0%.  moderate snoring was noted during this study.  CARDIAC DATA The 2 lead EKG demonstrated sinus rhythm. The mean heart rate was 60.6 beats per minute. Other EKG findings  include: None.   LEG MOVEMENT DATA The total PLMS were 0 with a resulting PLMS index of 0.0. Associated arousal with leg movement index was 9.1 .  IMPRESSIONS - Mild obstructive sleep apnea occurred during this study (AHI = 13.7/h). - Mild oxygen desaturation was noted during this study (Min O2 = 83.0%). - The patient snored with moderate snoring volume. - No cardiac abnormalities were noted during this study. - Clinically significant periodic limb movements did not occur during sleep. Associated arousals were significant.   DIAGNOSIS - Obstructive Sleep Apnea (G47.33)   RECOMMENDATIONS - Therapeutic CPAP titration to determine optimal pressure required to alleviate sleep disordered breathing.Alternatively, autoCPAP could be used - Positional therapy avoiding supine position during sleep. - Corelate with symptoms of restless legs & consider treatment of Periodic Leg Movements of Sleep. - Avoid alcohol, sedatives and other CNS depressants that may worsen sleep apnea and disrupt normal sleep architecture. - Sleep hygiene should be reviewed to assess factors that may improve sleep quality. - Weight management and regular exercise should be initiated or continued if appropriate.   Cyril Mourning MD Board Certified in Sleep medicine

## 2022-06-04 NOTE — Progress Notes (Signed)
Spoke with the pt and notified of results and recs per Dr Vassie Loll. He verbalized understanding. He states that he would like to think about whether or not he wants me to to place the order and will then call us back. Will hold onto this result note until we hear back.

## 2022-09-16 ENCOUNTER — Other Ambulatory Visit: Payer: Self-pay | Admitting: Gastroenterology

## 2022-10-07 ENCOUNTER — Encounter (HOSPITAL_COMMUNITY): Payer: Self-pay | Admitting: Gastroenterology

## 2022-10-07 ENCOUNTER — Ambulatory Visit (HOSPITAL_COMMUNITY)
Admission: RE | Admit: 2022-10-07 | Discharge: 2022-10-07 | Disposition: A | Discharge status: Home / Self Care | Payer: 59 | Attending: Gastroenterology | Admitting: Gastroenterology

## 2022-10-07 ENCOUNTER — Encounter (HOSPITAL_COMMUNITY)
Admission: RE | Disposition: A | Discharge status: Home / Self Care | Payer: Self-pay | Source: Home / Self Care | Attending: Gastroenterology

## 2022-10-07 DIAGNOSIS — R1310 Dysphagia, unspecified: Secondary | ICD-10-CM | POA: Insufficient documentation

## 2022-10-07 DIAGNOSIS — K222 Esophageal obstruction: Secondary | ICD-10-CM | POA: Diagnosis not present

## 2022-10-07 HISTORY — PX: ESOPHAGEAL MANOMETRY: SHX5429

## 2022-10-07 SURGERY — MANOMETRY, ESOPHAGUS
Laterality: Bilateral

## 2022-10-07 MED ORDER — LIDOCAINE VISCOUS HCL 2 % MT SOLN
OROMUCOSAL | Status: AC
  Filled 2022-10-07: qty 15

## 2022-10-07 SURGICAL SUPPLY — 2 items
FACESHIELD LNG OPTICON STERILE (SAFETY) IMPLANT
GLOVE BIO SURGEON STRL SZ8 (GLOVE) ×2 IMPLANT

## 2022-10-07 NOTE — Progress Notes (Signed)
Esophageal manometry performed per protocol without complications.  Patient tolerated well. 

## 2022-10-08 ENCOUNTER — Encounter (HOSPITAL_COMMUNITY): Payer: Self-pay | Admitting: Gastroenterology

## 2022-10-12 ENCOUNTER — Ambulatory Visit: Payer: Self-pay | Admitting: General Surgery

## 2022-10-12 NOTE — H&P (Signed)
REFERRING PHYSICIAN:  Simeon Craft, [place]   PROVIDER:  Elenora Gamma, MD   MRN: [id number] DOB: [birth year] DATE OF ENCOUNTER: 10/12/2022   Subjective    Chief Complaint: New Consultation (internal hemorrhoids)       History of Present Illness: Clifford Armstrong is a 61 y.o. male who is seen today as an office consultation at the request of Dr. Sallyanne Havers for evaluation of New Consultation (internal hemorrhoids) .  Patient reports an ongoing problem with rectal bleeding.  He has been seen by a GI doctor and banded several years ago on multiple occasions but continues to have problems with this.  He has developed anemia.  Colonoscopy reveals no other source for bleeding.  He reports regular soft bowel movements approximately 3-4 times a day.     Review of Systems: A complete review of systems was obtained from the patient.  I have reviewed this information and discussed as appropriate with the patient.  See HPI as well for other ROS.       Medical History: Past Medical History      Past Medical History:  Diagnosis Date   Anemia          There is no problem list on file for this patient.     Past Surgical History       Past Surgical History:  Procedure Laterality Date   HERNIA REPAIR       shoulder surgery            Allergies  No Known Allergies           Current Outpatient Medications on File Prior to Visit  Medication Sig Dispense Refill   rOPINIRole (REQUIP) 0.5 MG tablet Take by mouth       rosuvastatin (CRESTOR) 10 MG tablet Take 10 mg by mouth once daily       tamsulosin (FLOMAX) 0.4 mg capsule Take by mouth       pregabalin (LYRICA) 50 MG capsule 1 capsule Orally Twice a day for 30 days        No current facility-administered medications on file prior to visit.      Family History       Family History  Problem Relation Age of Onset   Skin cancer Mother     Breast cancer Mother     Stroke Father          Social History         Tobacco Use  Smoking Status Every Day   Types: Cigarettes  Smokeless Tobacco Never      Social History  Social History         Socioeconomic History   Marital status: Single  Tobacco Use   Smoking status: Every Day      Types: Cigarettes   Smokeless tobacco: Never  Substance and Sexual Activity   Alcohol use: Yes   Drug use: Never        Objective:         Vitals:    10/12/22 1117  BP: 120/70  Pulse: 70  Temp: 36.7 C (98 F)  SpO2: 98%  Weight: 78.5 kg (173 lb)  Height: 182.9 cm (6')      Exam Gen: NAD Rectal: Good rectal tone, no external hemorrhoid disease.     Labs, Imaging and Diagnostic Testing:   Procedure: Anoscopy Surgeon: Maisie Fus After the risks and benefits were explained, written consent was obtained for above procedure.  A medical assistant chaperone was present thoroughout  the entire procedure.  Anesthesia: none Diagnosis: rectal bleeding Findings: Grade 1 left lateral and right anterior hemorrhoid, grade 3 right posterior hemorrhoid. no external hemorrhoid disease noted     Assessment and Plan:  Diagnoses and all orders for this visit:   Prolapsed internal hemorrhoids, grade 73       61 year old male with rectal bleeding causing anemia.  We discussed this in detail.  I have recommended trans hemorrhoidal dearterialization.  We discussed the entire process.  We discussed risks which include bleeding, pain, recurrence and urinary retention.  All questions were answered.  We also discussed alternative treatments including hemorrhoidectomy and rubber band ligation and why I did not feel that these would be good options for him.   No follow-ups on file.    Vanita Panda, MD Colon and Rectal Surgery [facility]

## 2022-12-02 ENCOUNTER — Encounter (HOSPITAL_BASED_OUTPATIENT_CLINIC_OR_DEPARTMENT_OTHER): Payer: Self-pay | Admitting: General Surgery

## 2022-12-03 ENCOUNTER — Other Ambulatory Visit: Payer: Self-pay

## 2022-12-03 ENCOUNTER — Encounter (HOSPITAL_BASED_OUTPATIENT_CLINIC_OR_DEPARTMENT_OTHER): Payer: Self-pay | Admitting: General Surgery

## 2022-12-03 NOTE — Progress Notes (Signed)
Spoke w/ via phone for pre-op interview--- Lab needs dos---- none            Lab results------n/a COVID test -----patient states asymptomatic no test needed Arrive at -------0600 NPO after MN NO Solid Food.  Clear liquids from MN until---0500 Med rec completed Medications to take morning of surgery -----none Diabetic medication -----n/a Patient instructed no nail polish to be worn day of surgery Patient instructed to bring photo id and insurance card day of surgery Patient aware to have Driver (ride ) /  friend Administrator, Civil Service caregiver    for 24 hours after surgery  Patient Special Instructions -----n/a Pre-Op special Istructions -----n/a Patient verbalized understanding of instructions that were given at this phone interview. Patient denies shortness of breath, chest pain, fever, cough at this phone interview.

## 2022-12-10 ENCOUNTER — Ambulatory Visit (HOSPITAL_BASED_OUTPATIENT_CLINIC_OR_DEPARTMENT_OTHER)
Admission: RE | Admit: 2022-12-10 | Discharge: 2022-12-10 | Disposition: A | Discharge status: Home / Self Care | Payer: 59 | Attending: General Surgery | Admitting: General Surgery

## 2022-12-10 ENCOUNTER — Encounter (HOSPITAL_BASED_OUTPATIENT_CLINIC_OR_DEPARTMENT_OTHER): Payer: Self-pay | Admitting: General Surgery

## 2022-12-10 ENCOUNTER — Other Ambulatory Visit: Payer: Self-pay

## 2022-12-10 ENCOUNTER — Ambulatory Visit (HOSPITAL_BASED_OUTPATIENT_CLINIC_OR_DEPARTMENT_OTHER): Payer: 59 | Admitting: Certified Registered"

## 2022-12-10 ENCOUNTER — Encounter (HOSPITAL_BASED_OUTPATIENT_CLINIC_OR_DEPARTMENT_OTHER)
Admission: RE | Disposition: A | Discharge status: Home / Self Care | Payer: Self-pay | Source: Home / Self Care | Attending: General Surgery

## 2022-12-10 DIAGNOSIS — G4730 Sleep apnea, unspecified: Secondary | ICD-10-CM | POA: Diagnosis not present

## 2022-12-10 DIAGNOSIS — K219 Gastro-esophageal reflux disease without esophagitis: Secondary | ICD-10-CM | POA: Insufficient documentation

## 2022-12-10 DIAGNOSIS — D649 Anemia, unspecified: Secondary | ICD-10-CM | POA: Insufficient documentation

## 2022-12-10 DIAGNOSIS — K642 Third degree hemorrhoids: Secondary | ICD-10-CM | POA: Diagnosis present

## 2022-12-10 DIAGNOSIS — K625 Hemorrhage of anus and rectum: Secondary | ICD-10-CM | POA: Insufficient documentation

## 2022-12-10 HISTORY — PX: TRANSANAL HEMORRHOIDAL DEARTERIALIZATION: SHX6136

## 2022-12-10 HISTORY — DX: Presence of spectacles and contact lenses: Z97.3

## 2022-12-10 HISTORY — DX: Tinnitus, left ear: H93.12

## 2022-12-10 HISTORY — DX: Sleep apnea, unspecified: G47.30

## 2022-12-10 HISTORY — DX: Restless legs syndrome: G25.81

## 2022-12-10 SURGERY — TRANSANAL HEMORRHOIDAL DEARTERIALIZATION
Anesthesia: Monitor Anesthesia Care | Site: Rectum

## 2022-12-10 MED ORDER — FENTANYL CITRATE (PF) 100 MCG/2ML IJ SOLN
INTRAMUSCULAR | Status: AC
  Filled 2022-12-10: qty 2

## 2022-12-10 MED ORDER — ACETAMINOPHEN 500 MG PO TABS
ORAL_TABLET | ORAL | Status: AC
  Filled 2022-12-10: qty 2

## 2022-12-10 MED ORDER — CELECOXIB 200 MG PO CAPS
ORAL_CAPSULE | ORAL | Status: AC
  Filled 2022-12-10: qty 1

## 2022-12-10 MED ORDER — LACTATED RINGERS IV SOLN: INTRAVENOUS | Status: DC

## 2022-12-10 MED ORDER — CELECOXIB 200 MG PO CAPS
200.0000 mg | ORAL_CAPSULE | ORAL | Status: AC
  Administered 2022-12-10: 200 mg via ORAL

## 2022-12-10 MED ORDER — OXYCODONE HCL 5 MG/5ML PO SOLN: 5.0000 mg | Freq: Once | ORAL | Status: DC | PRN

## 2022-12-10 MED ORDER — OXYCODONE HCL 5 MG PO TABS: 5.0000 mg | ORAL_TABLET | Freq: Once | ORAL | Status: DC | PRN

## 2022-12-10 MED ORDER — FENTANYL CITRATE (PF) 100 MCG/2ML IJ SOLN: 25.0000 ug | INTRAMUSCULAR | Status: DC | PRN

## 2022-12-10 MED ORDER — ACETAMINOPHEN 500 MG PO TABS
1000.0000 mg | ORAL_TABLET | ORAL | Status: AC
  Administered 2022-12-10: 1000 mg via ORAL

## 2022-12-10 MED ORDER — 0.9 % SODIUM CHLORIDE (POUR BTL) OPTIME
TOPICAL | Status: DC | PRN
  Administered 2022-12-10: 500 mL

## 2022-12-10 MED ORDER — GABAPENTIN 300 MG PO CAPS
ORAL_CAPSULE | ORAL | Status: AC
  Filled 2022-12-10: qty 1

## 2022-12-10 MED ORDER — MIDAZOLAM HCL 2 MG/2ML IJ SOLN
INTRAMUSCULAR | Status: DC | PRN
  Administered 2022-12-10: 1 mg via INTRAVENOUS

## 2022-12-10 MED ORDER — MIDAZOLAM HCL 2 MG/2ML IJ SOLN
INTRAMUSCULAR | Status: AC
  Filled 2022-12-10: qty 2

## 2022-12-10 MED ORDER — KETAMINE HCL 10 MG/ML IJ SOLN
INTRAMUSCULAR | Status: DC | PRN
  Administered 2022-12-10 (×5): 10 mg via INTRAVENOUS

## 2022-12-10 MED ORDER — FENTANYL CITRATE (PF) 100 MCG/2ML IJ SOLN
INTRAMUSCULAR | Status: DC | PRN
  Administered 2022-12-10: 50 ug via INTRAVENOUS

## 2022-12-10 MED ORDER — PROPOFOL 10 MG/ML IV BOLUS
INTRAVENOUS | Status: DC | PRN
  Administered 2022-12-10: 20 mg via INTRAVENOUS

## 2022-12-10 MED ORDER — BUPIVACAINE-EPINEPHRINE (PF) 0.5% -1:200000 IJ SOLN
INTRAMUSCULAR | Status: DC | PRN
  Administered 2022-12-10: 50 mL

## 2022-12-10 MED ORDER — DIAZEPAM 5 MG PO TABS: 5.0000 mg | ORAL_TABLET | Freq: Three times a day (TID) | ORAL | 0 refills | Status: AC | PRN

## 2022-12-10 MED ORDER — ONDANSETRON HCL 4 MG/2ML IJ SOLN: 4.0000 mg | Freq: Four times a day (QID) | INTRAMUSCULAR | Status: DC | PRN

## 2022-12-10 MED ORDER — PROPOFOL 500 MG/50ML IV EMUL
INTRAVENOUS | Status: DC | PRN
  Administered 2022-12-10: 200 ug/kg/min via INTRAVENOUS

## 2022-12-10 MED ORDER — SODIUM CHLORIDE 0.9% FLUSH: 3.0000 mL | Freq: Two times a day (BID) | INTRAVENOUS | Status: DC

## 2022-12-10 MED ORDER — KETAMINE HCL 50 MG/5ML IJ SOSY
PREFILLED_SYRINGE | INTRAMUSCULAR | Status: AC
  Filled 2022-12-10: qty 5

## 2022-12-10 MED ORDER — GABAPENTIN 300 MG PO CAPS
300.0000 mg | ORAL_CAPSULE | ORAL | Status: AC
  Administered 2022-12-10: 300 mg via ORAL

## 2022-12-10 MED ORDER — OXYCODONE HCL 5 MG PO TABS: 5.0000 mg | ORAL_TABLET | Freq: Four times a day (QID) | ORAL | 0 refills | Status: AC | PRN

## 2022-12-10 MED ORDER — BUPIVACAINE LIPOSOME 1.3 % IJ SUSP: 20.0000 mL | Freq: Once | INTRAMUSCULAR | Status: DC

## 2022-12-10 SURGICAL SUPPLY — 36 items
BRIEF MESH DISP LRG (UNDERPADS AND DIAPERS) IMPLANT
COVER BACK TABLE 60X90IN (DRAPES) ×1 IMPLANT
DRAPE HYSTEROSCOPY (MISCELLANEOUS) ×1 IMPLANT
DRAPE SHEET LG 3/4 BI-LAMINATE (DRAPES) ×1 IMPLANT
ELECT REM PT RETURN 9FT ADLT (ELECTROSURGICAL) ×1
ELECTRODE REM PT RTRN 9FT ADLT (ELECTROSURGICAL) ×1 IMPLANT
GAUZE 4X4 16PLY ~~LOC~~+RFID DBL (SPONGE) ×1 IMPLANT
GAUZE PAD ABD 8X10 STRL (GAUZE/BANDAGES/DRESSINGS) IMPLANT
GAUZE SPONGE 4X4 12PLY STRL (GAUZE/BANDAGES/DRESSINGS) IMPLANT
GLOVE BIO SURGEON STRL SZ 6.5 (GLOVE) ×1 IMPLANT
GLOVE BIOGEL PI IND STRL 7.0 (GLOVE) ×1 IMPLANT
GLOVE INDICATOR 6.5 STRL GRN (GLOVE) ×1 IMPLANT
GOWN STRL REUS W/ TWL LRG LVL3 (GOWN DISPOSABLE) IMPLANT
GOWN STRL REUS W/TWL LRG LVL3 (GOWN DISPOSABLE) ×1
GOWN STRL REUS W/TWL XL LVL3 (GOWN DISPOSABLE) ×1 IMPLANT
HEMOSTAT SURGICEL 4X8 (HEMOSTASIS) IMPLANT
KIT SIGMOIDOSCOPE (SET/KITS/TRAYS/PACK) IMPLANT
KIT SLIDE ONE PROLAPS HEMORR (KITS) IMPLANT
KIT TURNOVER CYSTO (KITS) ×1 IMPLANT
LEGGING LITHOTOMY PAIR STRL (DRAPES) ×1 IMPLANT
LUBRICANT JELLY K Y 4OZ (MISCELLANEOUS) ×1 IMPLANT
NEEDLE HYPO 22GX1.5 SAFETY (NEEDLE) ×1 IMPLANT
NS IRRIG 500ML POUR BTL (IV SOLUTION) IMPLANT
PACK BASIN DAY SURGERY FS (CUSTOM PROCEDURE TRAY) ×1 IMPLANT
PAD ARMBOARD 7.5X6 YLW CONV (MISCELLANEOUS) IMPLANT
PENCIL SMOKE EVACUATOR (MISCELLANEOUS) ×1 IMPLANT
SPIKE FLUID TRANSFER (MISCELLANEOUS) IMPLANT
SPONGE HEMORRHOID 8X3CM (HEMOSTASIS) IMPLANT
SUT CHROMIC 2 0 SH (SUTURE) IMPLANT
SUT CHROMIC 3 0 SH 27 (SUTURE) IMPLANT
SUT VIC AB 2-0 UR6 27 (SUTURE) IMPLANT
SYR CONTROL 10ML LL (SYRINGE) ×1 IMPLANT
TOWEL OR 17X26 10 PK STRL BLUE (TOWEL DISPOSABLE) ×1 IMPLANT
TRAY DSU PREP LF (CUSTOM PROCEDURE TRAY) ×1 IMPLANT
TUBE CONNECTING 12X1/4 (SUCTIONS) ×1 IMPLANT
YANKAUER SUCT BULB TIP NO VENT (SUCTIONS) ×1 IMPLANT

## 2022-12-10 NOTE — Anesthesia Postprocedure Evaluation (Signed)
Anesthesia Post Note  Patient: Clifford Armstrong  Procedure(s) Performed: TRANSANAL HEMORRHOIDAL DEARTERIALIZATION (Rectum)     Patient location during evaluation: PACU Anesthesia Type: MAC Level of consciousness: awake and alert Pain management: pain level controlled Vital Signs Assessment: post-procedure vital signs reviewed and stable Respiratory status: spontaneous breathing, nonlabored ventilation, respiratory function stable and patient connected to nasal cannula oxygen Cardiovascular status: stable and blood pressure returned to baseline Postop Assessment: no apparent nausea or vomiting Anesthetic complications: no   No notable events documented.  Last Vitals:  Vitals:   12/10/22 1135 12/10/22 1210  BP:  131/81  Pulse: (!) 56 (!) 56  Resp: 14 13  Temp: (!) 36.3 C 36.4 C  SpO2: 100% 97%    Last Pain:  Vitals:   12/10/22 1210  TempSrc:   PainSc: 0-No pain                 Kristion Holifield S

## 2022-12-10 NOTE — H&P (Signed)
REFERRING PHYSICIAN:  Kristeen Miss, [place]   PROVIDER:  Monico Blitz, MD   MRN: [id number] DOB: Jul 23, 1961    Subjective    Chief Complaint: New Consultation (internal hemorrhoids)       History of Present Illness: Clifford Armstrong is a 62 y.o. male who is seen today as an office consultation at the request of Dr. Aleene Davidson for evaluation of New Consultation (internal hemorrhoids) .  Patient reports an ongoing problem with rectal bleeding.  He has been seen by a GI doctor and banded several years ago on multiple occasions but continues to have problems with this.  He has developed anemia.  Colonoscopy reveals no other source for bleeding.  He reports regular soft bowel movements approximately 3-4 times a day.     Review of Systems: A complete review of systems was obtained from the patient.  I have reviewed this information and discussed as appropriate with the patient.  See HPI as well for other ROS.       Medical History: Past Medical History         Past Medical History:  Diagnosis Date   Anemia          There is no problem list on file for this patient.     Past Surgical History           Past Surgical History:  Procedure Laterality Date   HERNIA REPAIR       shoulder surgery            Allergies  No Known Allergies                 Current Outpatient Medications on File Prior to Visit  Medication Sig Dispense Refill   rOPINIRole (REQUIP) 0.5 MG tablet Take by mouth       rosuvastatin (CRESTOR) 10 MG tablet Take 10 mg by mouth once daily       tamsulosin (FLOMAX) 0.4 mg capsule Take by mouth       pregabalin (LYRICA) 50 MG capsule 1 capsule Orally Twice a day for 30 days        No current facility-administered medications on file prior to visit.      Family History           Family History  Problem Relation Age of Onset   Skin cancer Mother     Breast cancer Mother     Stroke Father          Social History           Tobacco  Use  Smoking Status Every Day   Types: Cigarettes  Smokeless Tobacco Never      Social History  Social History             Socioeconomic History   Marital status: Single  Tobacco Use   Smoking status: Every Day      Types: Cigarettes   Smokeless tobacco: Never  Substance and Sexual Activity   Alcohol use: Yes   Drug use: Never        Objective:     BP 135/70   Pulse (!) 56   Temp (!) 97.4 F (36.3 C) (Oral)   Resp 16   Ht 5\' 11"  (1.803 m)   Wt 79.2 kg   SpO2 100%   BMI 24.37 kg/m     Exam Gen: NAD Rectal: Good rectal tone, no external hemorrhoid disease.     Labs, Imaging and Diagnostic Testing:  Procedure: Anoscopy 10/12/2022 Surgeon: Marcello Moores After the risks and benefits were explained, written consent was obtained for above procedure.  A medical assistant chaperone was present thoroughout the entire procedure.  Anesthesia: none Diagnosis: rectal bleeding Findings: Grade 1 left lateral and right anterior hemorrhoid, grade 3 right posterior hemorrhoid. no external hemorrhoid disease noted     Assessment and Plan:  Diagnoses and all orders for this visit:   Prolapsed internal hemorrhoids, grade 28       62 year old male with rectal bleeding causing anemia.  We discussed this in detail.  I have recommended trans hemorrhoidal dearterialization.  We discussed the entire process.  We discussed risks which include bleeding, pain, recurrence and urinary retention.  All questions were answered.  We also discussed alternative treatments including hemorrhoidectomy and rubber band ligation and why I did not feel that these would be good options for him.   No follow-ups on file.    Rosario Adie, MD Colon and Rectal Surgery [facility]

## 2022-12-10 NOTE — Discharge Instructions (Addendum)
ANORECTAL SURGERY: POST OP INSTRUCTIONS Take your usually prescribed home medications unless otherwise directed. DIET: During the first few hours after surgery sip on some liquids until you are able to urinate.  It is normal to not urinate for several hours after this surgery.  If you feel uncomfortable, please contact the office for instructions.  After you are able to urinate,you may eat, if you feel like it.  Follow a light bland diet the first 24 hours after arrival home, such as soup, liquids, crackers, etc.  Be sure to include lots of fluids daily (6-8 glasses).  Avoid fast food or heavy meals, as your are more likely to get nauseated.  Eat a low fat diet the next few days after surgery.  Limit caffeine intake to 1-2 servings a day. PAIN CONTROL: Pain is best controlled by a usual combination of several different methods TOGETHER: Muscle relaxation  Soak in a warm bath (or Sitz bath) three times a day and after bowel movements.  Continue to do this until all pain is resolved. Take the muscle relaxer (Valium) every 6 hours for the first 2 days after surgery  Over the counter pain medication Prescription pain medication Most patients will experience some swelling and discomfort in the anus/rectal area and incisions.  Heat such as warm towels, sitz baths, warm baths, etc to help relax tight/sore spots and speed recovery.  Some people prefer to use ice, especially in the first couple days after surgery, as it may decrease the pain and swelling, or alternate between ice & heat.  Experiment to what works for you.  Swelling and bruising can take several weeks to resolve.  Pain can take even longer to completely resolve. It is helpful to take an over-the-counter pain medication regularly for the first few weeks.  Choose one of the following that works best for you: Naproxen (Aleve, etc)  Two '220mg'$  tabs twice a day Ibuprofen (Advil, etc) Three '200mg'$  tabs four times a day (every meal & bedtime) A   prescription for pain medication (such as percocet, oxycodone, hydrocodone, etc) should be given to you upon discharge.  Take your pain medication as prescribed.  If you are having problems/concerns with the prescription medicine (does not control pain, nausea, vomiting, rash, itching, etc), please call us [phone] to see if we need to switch you to a different pain medicine that will work better for you and/or control your side effect better. If you need a refill on your pain medication, please contact your pharmacy.  They will contact our office to request authorization. Prescriptions will not be filled after 5 pm or on week-ends. KEEP YOUR BOWELS REGULAR and AVOID CONSTIPATION The goal is one to two soft bowel movements a day.  You should at least have a bowel movement every other day. Avoid getting constipated.  Between the surgery and the pain medications, it is common to experience some constipation. This can be very painful after rectal surgery.  Increasing fluid intake and taking a fiber supplement (such as Metamucil, Citrucel, FiberCon, etc) 1-2 times a day regularly will usually help prevent this problem from occurring.  A stool softener like colace is also recommended.  This can be purchased over the counter at your pharmacy.  You can take it up to 3 times a day.  If you do not have a bowel movement after 24 hrs since your surgery, take one does of milk of magnesia.  If you still haven't had a bowel movement 8-12 hours after  that dose, take another dose.  If you don't have a bowel movement 48 hrs after surgery, purchase a Fleets enema from the drug store and administer gently per package instructions.  If you still are having trouble with your bowel movements after that, please call the office for further instructions. If you develop diarrhea or have many loose bowel movements, simplify your diet to bland foods & liquids for a few days.  Stop any stool softeners and decrease your fiber  supplement.  Switching to mild anti-diarrheal medications (Kayopectate, Pepto Bismol) can help.  If this worsens or does not improve, please call us.  Wound Care Remove your bandages before your first bowel movement or 8 hours after surgery.     Remove any wound packing material at this tim,e as well.  You do not need to repack the wound unless instructed otherwise.  Wear an absorbent pad or soft cotton gauze in your underwear to catch any drainage and help keep the area clean. You should change this every 2-3 hours while awake. Keep the area clean and dry.  Bathe / shower every day, especially after bowel movements.  Keep the area clean by showering / bathing over the incision / wound.   It is okay to soak an open wound to help wash it.  Wet wipes or showers / gentle washing after bowel movements is often less traumatic than regular toilet paper. You may have some styrofoam-like soft packing in the rectum which will come out with the first bowel movement.  You will often notice bleeding with bowel movements.  This should slow down by the end of the first week of surgery Expect some drainage.  This should slow down, too, by the end of the first week of surgery.  Wear an absorbent pad or soft cotton gauze in your underwear until the drainage stops. Do Not sit on a rubber or pillow ring.  This can make you symptoms worse.  You may sit on a soft pillow if needed.  ACTIVITIES as tolerated:   You may resume regular (light) daily activities beginning the next day--such as daily self-care, walking, climbing stairs--gradually increasing activities as tolerated.  If you can walk 30 minutes without difficulty, it is safe to try more intense activity such as jogging, treadmill, bicycling, low-impact aerobics, swimming, etc. Save the most intensive and strenuous activity for last such as sit-ups, heavy lifting, contact sports, etc  Refrain from any heavy lifting or straining until you are off narcotics for pain  control.   You may drive when you are no longer taking prescription pain medication, you can comfortably sit for long periods of time, and you can safely maneuver your car and apply brakes. You may have sexual intercourse when it is comfortable.  FOLLOW UP in our office Please call [facility] at [phone] to set up an appointment to see your surgeon in the office for a follow-up appointment approximately 3-4 weeks after your surgery. Make sure that you call for this appointment the day you arrive home to insure a convenient appointment time. 10. IF YOU HAVE DISABILITY OR FAMILY LEAVE FORMS, BRING THEM TO THE OFFICE FOR PROCESSING.  DO NOT GIVE THEM TO YOUR DOCTOR.     WHEN TO CALL us [phone]: Poor pain control Reactions / problems with new medications (rash/itching, nausea, etc)  Fever over 101.5 F (38.5 C) Inability to urinate Nausea and/or vomiting Worsening swelling or bruising Continued bleeding from incision. Increased pain, redness, or drainage from the  incision  The clinic staff is available to answer your questions during regular business hours (8:30am-5pm).  Please don't hesitate to call and ask to speak to one of our nurses for clinical concerns.   A surgeon from [facility] is always on call at the hospitals   If you have a medical emergency, go to the nearest emergency room or call 911.    [facility], [place], [place], [place], [place]  [address] ? MAIN: [phone]) [phone] ? TOLL FREE: [phone] ? FAX [phone] [email or web address]    Post Anesthesia Home Care Instructions  Activity: Get plenty of rest for the remainder of the day. A responsible adult should stay with you for 24 hours following the procedure.  For the next 24 hours, DO NOT: -Drive a car -Paediatric nurse -Drink alcoholic beverages -Take any medication unless instructed by your physician -Make any legal decisions or sign important  papers.  Meals: Start with liquid foods such as gelatin or soup. Progress to regular foods as tolerated. Avoid greasy, spicy, heavy foods. If nausea and/or vomiting occur, drink only clear liquids until the nausea and/or vomiting subsides. Call your physician if vomiting continues.  Special Instructions/Symptoms: Your throat may feel dry or sore from the anesthesia or the breathing tube placed in your throat during surgery. If this causes discomfort, gargle with warm salt water. The discomfort should disappear within 24 hours.      Information for Discharge Teaching: EXPAREL (bupivacaine liposome injectable suspension)   Your surgeon gave you EXPAREL(bupivacaine) in your surgical incision to help control your pain after surgery.  EXPAREL is a local anesthetic that provides pain relief by numbing the tissue around the surgical site. EXPAREL is designed to release pain medication over time and can control pain for up to 72 hours. Depending on how you respond to EXPAREL, you may require less pain medication during your recovery.  Possible side effects: Temporary loss of sensation or ability to move in the area where bupivacaine was injected. Nausea, vomiting, constipation Rarely, numbness and tingling in your mouth or lips, lightheadedness, or anxiety may occur. Call your doctor right away if you think you may be experiencing any of these sensations, or if you have other questions regarding possible side effects.  Follow all other discharge instructions given to you by your surgeon or nurse. Eat a healthy diet and drink plenty of water or other fluids.  If you return to the hospital for any reason within 96 hours following the administration of EXPAREL, please inform your health care providers.

## 2022-12-10 NOTE — Anesthesia Procedure Notes (Signed)
Procedure Name: MAC Date/Time: 12/10/2022 9:42 AM  Performed by: Suan Halter, CRNAPre-anesthesia Checklist: Patient identified, Emergency Drugs available, Suction available, Patient being monitored and Timeout performed Patient Re-evaluated:Patient Re-evaluated prior to induction Oxygen Delivery Method: Simple face mask

## 2022-12-10 NOTE — Anesthesia Preprocedure Evaluation (Signed)
Anesthesia Evaluation  Patient identified by MRN, date of birth, ID band Patient awake    Reviewed: Allergy & Precautions, H&P , NPO status , Patient's Chart, lab work & pertinent test results  Airway Mallampati: II   Neck ROM: full    Dental   Pulmonary sleep apnea    breath sounds clear to auscultation       Cardiovascular negative cardio ROS  Rhythm:regular Rate:Normal     Neuro/Psych    GI/Hepatic ,GERD  ,,  Endo/Other    Renal/GU      Musculoskeletal   Abdominal   Peds  Hematology   Anesthesia Other Findings   Reproductive/Obstetrics                             Anesthesia Physical Anesthesia Plan  ASA: 2  Anesthesia Plan: MAC   Post-op Pain Management:    Induction: Intravenous  PONV Risk Score and Plan: 1 and Propofol infusion, Midazolam and Treatment may vary due to age or medical condition  Airway Management Planned: Simple Face Mask  Additional Equipment:   Intra-op Plan:   Post-operative Plan:   Informed Consent: I have reviewed the patients History and Physical, chart, labs and discussed the procedure including the risks, benefits and alternatives for the proposed anesthesia with the patient or authorized representative who has indicated his/her understanding and acceptance.     Dental advisory given  Plan Discussed with: CRNA, Anesthesiologist and Surgeon  Anesthesia Plan Comments:        Anesthesia Quick Evaluation

## 2022-12-10 NOTE — Op Note (Signed)
12/10/2022  10:32 AM  PATIENT:  Clifford Armstrong  62 y.o. male  Patient Care Team: Deland Pretty, MD as PCP - General (Internal Medicine)  PRE-OPERATIVE DIAGNOSIS:  GRADE 3 HEMORRHOID RECTAL BLEEDING  POST-OPERATIVE DIAGNOSIS:  GRADE 3 HEMORRHOID RECTAL BLEEDING  PROCEDURE: TRANSANAL HEMORRHOIDAL DEARTERIALIZATION    Surgeon(s): Leighton Ruff, MD  ASSISTANT: none   ANESTHESIA:   local and MAC  EBL:  Total I/O In: 200 [I.V.:200] Out: -   DRAINS: none   SPECIMEN:  No Specimen  DISPOSITION OF SPECIMEN:  N/A  COUNTS:  YES  PLAN OF CARE: Discharge to home after PACU  PATIENT DISPOSITION:  PACU - hemodynamically stable.  INDICATION: 62 y.o. M with    OR FINDINGS: 62 year old male with rectal bleeding due to hemorrhoids causing anemia.  Description: Informed consent was confirmed. Patient underwent general anesthesia without difficulty. Patient was placed into lithotomy positioning.  The perianal region was prepped and draped in sterile fashion. Surgical time out confirmed or plan.  I did digital rectal examination and then transitioned over to anoscopy to get a sense of the anatomy.  I switched over to the [facility] fiberoptically lit Doppler anocope.   Using the Doppler on the tip of the [place] anoscope, I identified the arterial hemorrhoidal vessels coming in in the classic hexagonal anatomical pattern (right posterior/lateral/anterior, left posterior /lateral/anterior).    I proceeded to ligate the hemorrhoidal arteries. I used a 2-0 Vicryl suture on a UR-6 needle in a figure-of-eight fashion over the signal around 6 cm proximal to the anal verge. I then ran that stitch longitudinally more distally to the dentate line. I then tied that stitch down to cause a hemorrhoidopexy. I did that for all 6 locations.    I redid Doppler anoscopy. I Identified a signal at the LL and RP locations.  I isolated and ligated this with a figure-of-eight stitch. Signals went away.  At completion of  this, all hemorrhoids were reduced into the rectum.  There is no more prolapse. External anatomy looked normal.  I repeated anoscopy and examination.   Hemostasis was good.  Patient is being extubated go to recovery room.  I am about to discuss the patient's status to the family.    Rosario Adie, MD  Col[facility] [place] [facility]

## 2022-12-10 NOTE — Anesthesia Procedure Notes (Signed)
Procedure Name: MAC Date/Time: 12/10/2022 9:50 AM  Performed by: Suan Halter, CRNAPre-anesthesia Checklist: Patient identified, Emergency Drugs available, Suction available and Patient being monitored Oxygen Delivery Method: Simple face mask

## 2022-12-10 NOTE — Transfer of Care (Signed)
Immediate Anesthesia Transfer of Care Note  Patient: Clifford Armstrong  Procedure(s) Performed: Procedure(s) (LRB): TRANSANAL HEMORRHOIDAL DEARTERIALIZATION (N/A)  Patient Location: PACU  Anesthesia Type: MAC  Level of Consciousness: awake, alert , oriented and patient cooperative  Airway & Oxygen Therapy: Patient Spontanous Breathing and Patient connected to face mask oxygen  Post-op Assessment: Report given to PACU RN and Post -op Vital signs reviewed and stable  Post vital signs: Reviewed and stable  Complications: No apparent anesthesia complications

## 2022-12-13 ENCOUNTER — Encounter (HOSPITAL_BASED_OUTPATIENT_CLINIC_OR_DEPARTMENT_OTHER): Payer: Self-pay | Admitting: General Surgery

## 2023-01-06 ENCOUNTER — Other Ambulatory Visit: Payer: Self-pay

## 2023-05-25 IMAGING — CT CT CARDIAC CORONARY ARTERY CALCIUM SCORE
3 series · 14 of 20 positions shown, 16 images · non-contrast
Comparison: None.

CLINICAL DATA: Family history

EXAM:
CT CARDIAC CORONARY ARTERY CALCIUM SCORE
TECHNIQUE: Non-contrast imaging through the heart was performed using
prospective ECG gating. Image post processing was performed on an
independent workstation, allowing for quantitative analysis of the
heart and coronary arteries. Note that this exam targets the heart
and the chest was not imaged in its entirety.

[Series 2: calcium scoring 2.00 qr36 bestdiast 69% hrt calciu · axial · 0.38mm/px · z∈[+1492,+1596]mm · 4 of 88 slices shown]
[im 18/88  vessel]
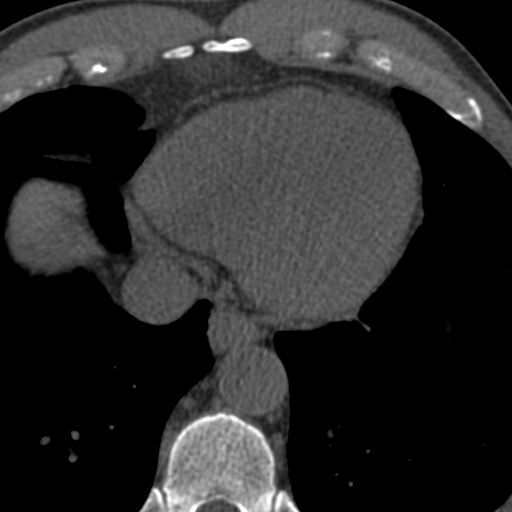
[im 35/88  vessel]
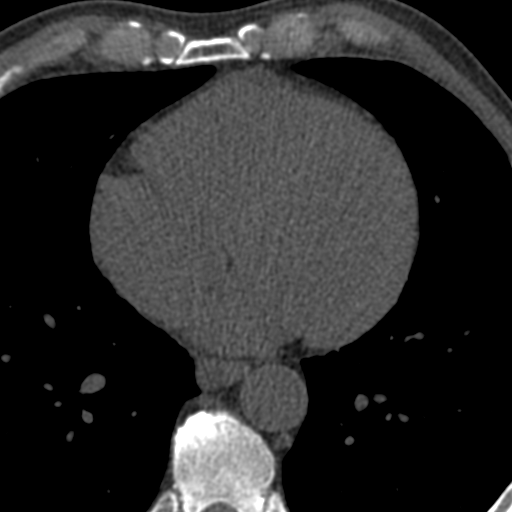
[im 53/88  vessel]
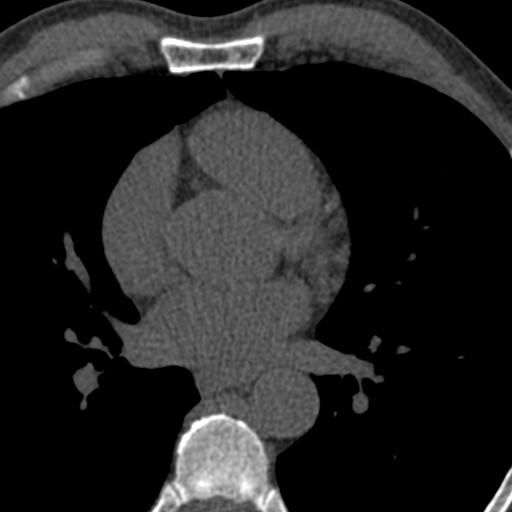
[im 70/88  vessel]
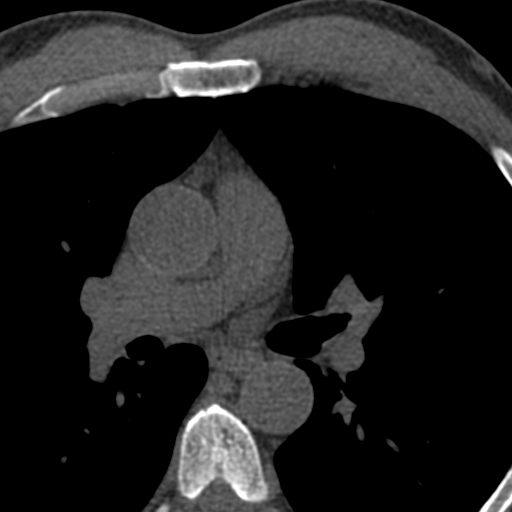

[Series 3: calcium scoring 2.00 br40 bestdiast 69% axial · axial · 0.56mm/px · z∈[+1466,+1598]mm · 5 of 100 slices shown, 7 images]
[im 17/100  vessel]
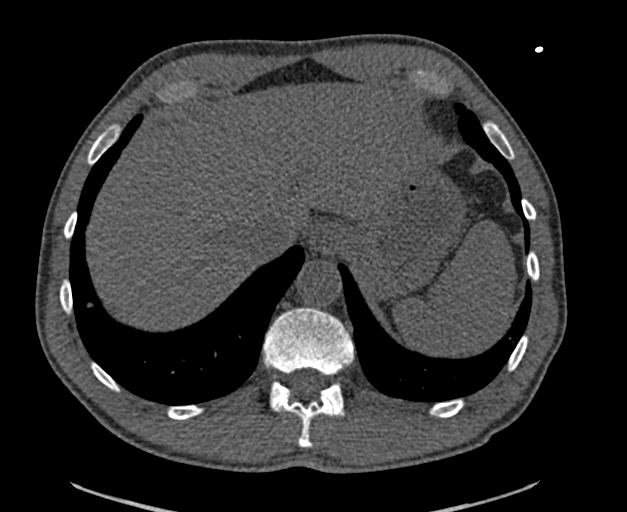
[im 17/100  lung]
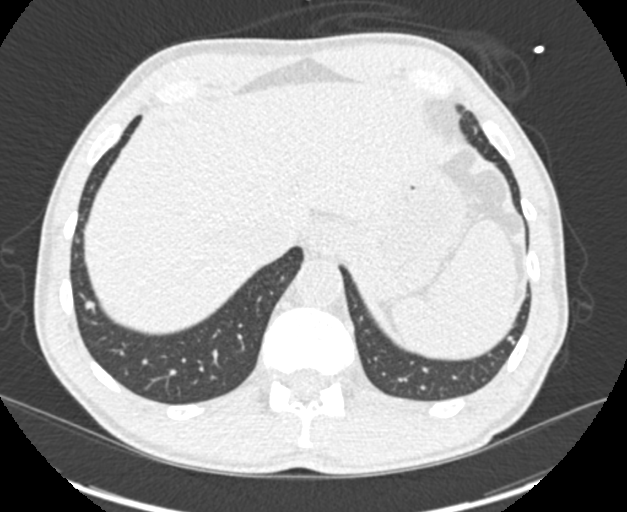
[im 34/100  vessel]
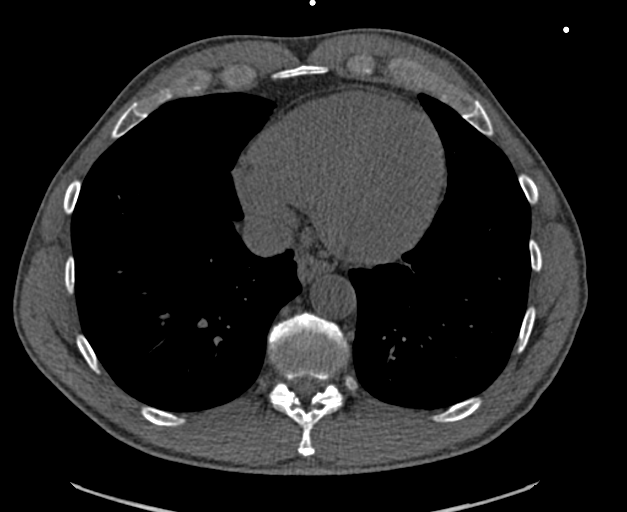
[im 50/100  vessel]
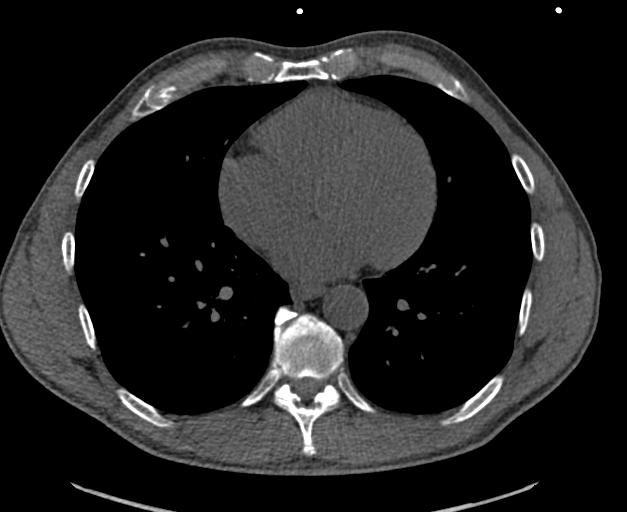
[im 67/100  vessel]
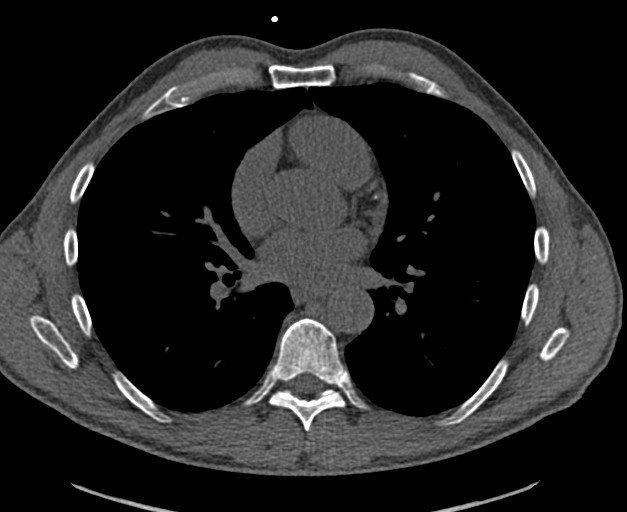
[im 83/100  vessel]
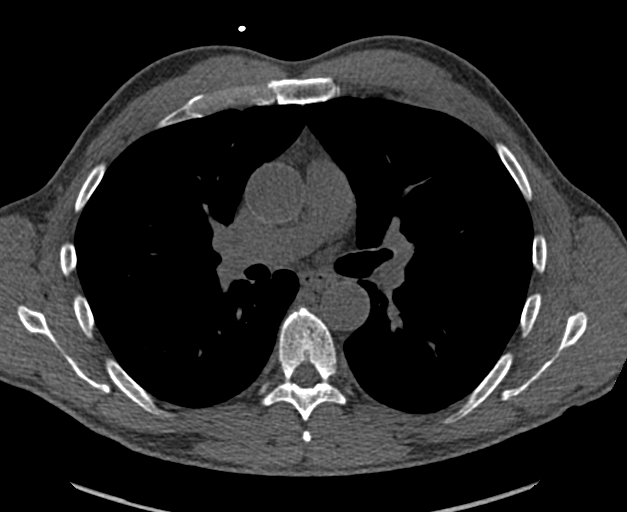
[im 83/100  lung]
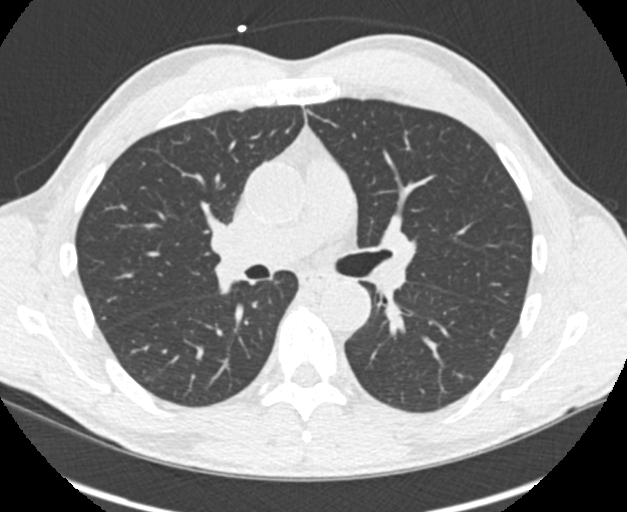

[Series 9: calcium scoring 2.00 br60 bestdiast 69% lungs · axial · 0.56mm/px · z∈[+1466,+1598]mm · 5 of 100 slices shown]
[im 17/100  vessel]
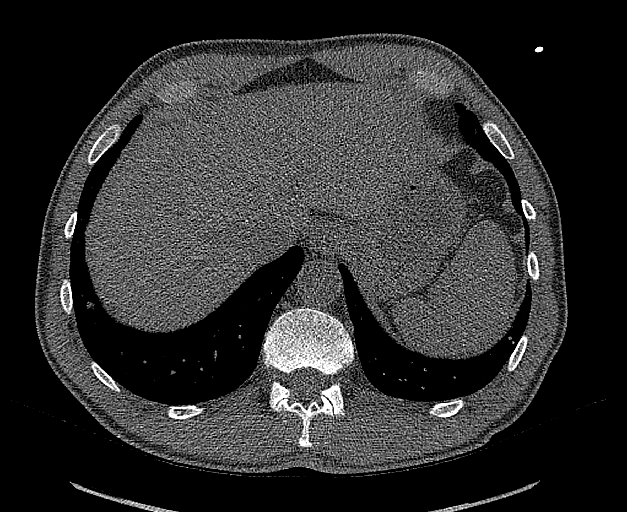
[im 34/100  vessel]
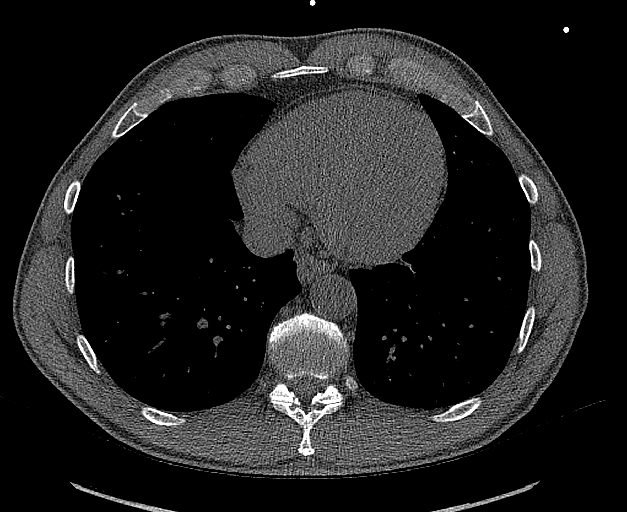
[im 50/100  vessel]
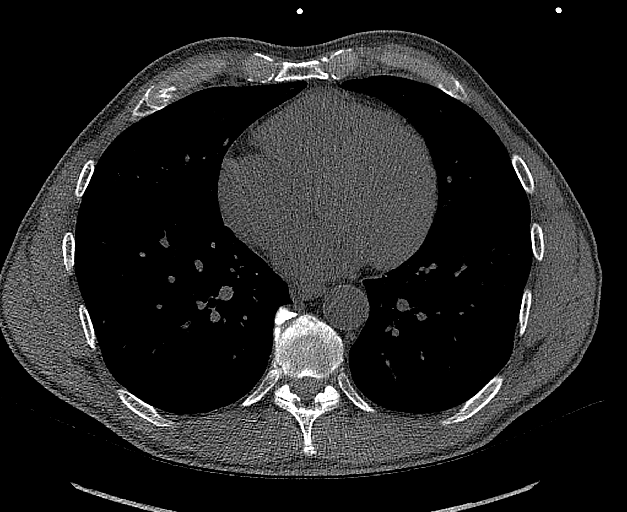
[im 67/100  vessel]
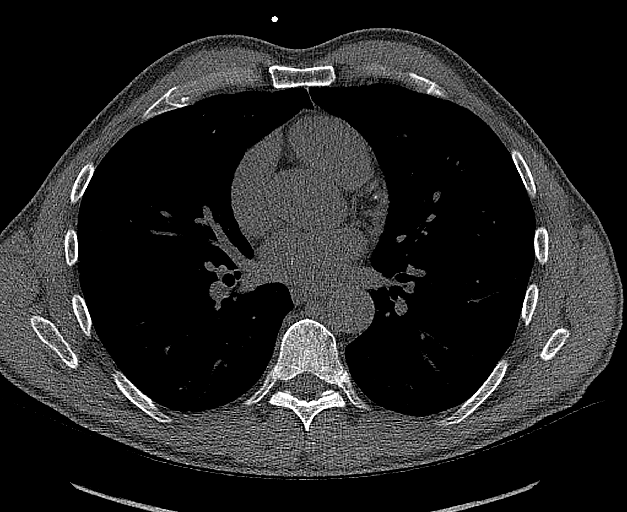
[im 83/100  vessel]
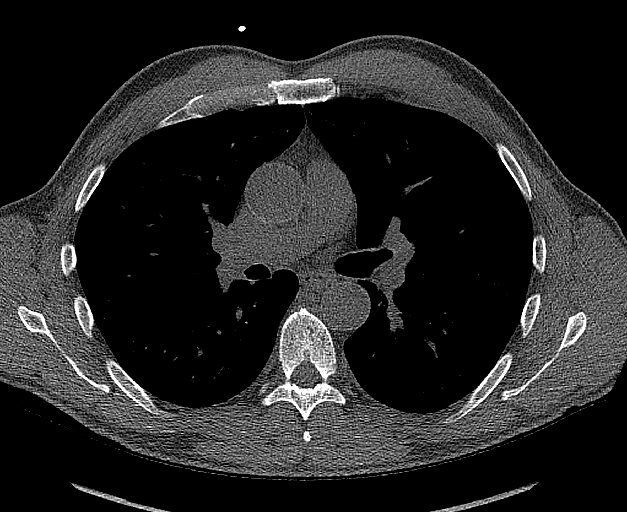

[14 of 20 positions shown; findings below may reference images not displayed]

FINDINGS: CORONARY CALCIUM SCORES:

Left Main: 0

LAD:

LCx:

RCA: 0

Total Agatston Score:

[HOSPITAL] percentile: 65

AORTA MEASUREMENTS:

Ascending Aorta: 32 mm

Descending Aorta: 25 mm

OTHER FINDINGS:

Heart is normal size. Aorta normal caliber. No adenopathy. No
effusions. Small nodule at the left lung base on image 82 of series
9 measures 5 mm. No acute findings in the upper abdomen. Chest wall
soft tissues are unremarkable. No acute bony abnormality.
IMPRESSION: Total Agatston score:

[HOSPITAL] percentile: 65

5 mm nodule in the left lower lobe. No follow-up needed if patient
is low-risk.This recommendation follows the consensus statement:
Guidelines for Management of Incidental Pulmonary Nodules Detected

## 2023-07-12 ENCOUNTER — Other Ambulatory Visit: Payer: Self-pay

## 2023-07-12 ENCOUNTER — Ambulatory Visit (HOSPITAL_COMMUNITY)
Admission: RE | Admit: 2023-07-12 | Discharge: 2023-07-12 | Disposition: A | Discharge status: Home / Self Care | Payer: 59 | Source: Ambulatory Visit

## 2023-07-12 DIAGNOSIS — R0781 Pleurodynia: Secondary | ICD-10-CM | POA: Insufficient documentation

## 2023-07-12 DIAGNOSIS — R140 Abdominal distension (gaseous): Secondary | ICD-10-CM | POA: Insufficient documentation

## 2023-07-12 MED ORDER — IOHEXOL 350 MG/ML SOLN
75.0000 mL | Freq: Once | INTRAVENOUS | Status: AC | PRN
  Administered 2023-07-12: 75 mL via INTRAVENOUS

## 2023-08-16 ENCOUNTER — Other Ambulatory Visit: Payer: Self-pay | Admitting: Nurse Practitioner

## 2023-08-16 DIAGNOSIS — R1030 Lower abdominal pain, unspecified: Secondary | ICD-10-CM

## 2023-08-18 ENCOUNTER — Ambulatory Visit
Admission: RE | Admit: 2023-08-18 | Discharge: 2023-08-18 | Disposition: A | Discharge status: Home / Self Care | Payer: 59 | Source: Ambulatory Visit | Attending: Nurse Practitioner | Admitting: Nurse Practitioner

## 2023-08-18 DIAGNOSIS — R1030 Lower abdominal pain, unspecified: Secondary | ICD-10-CM

## 2023-11-15 DIAGNOSIS — K222 Esophageal obstruction: Secondary | ICD-10-CM | POA: Diagnosis not present

## 2023-11-15 DIAGNOSIS — K529 Noninfective gastroenteritis and colitis, unspecified: Secondary | ICD-10-CM | POA: Diagnosis not present

## 2023-11-15 DIAGNOSIS — R195 Other fecal abnormalities: Secondary | ICD-10-CM | POA: Diagnosis not present

## 2023-11-15 DIAGNOSIS — K224 Dyskinesia of esophagus: Secondary | ICD-10-CM | POA: Diagnosis not present

## 2023-12-22 DIAGNOSIS — G4733 Obstructive sleep apnea (adult) (pediatric): Secondary | ICD-10-CM | POA: Diagnosis not present

## 2023-12-22 DIAGNOSIS — K224 Dyskinesia of esophagus: Secondary | ICD-10-CM | POA: Diagnosis not present

## 2023-12-22 DIAGNOSIS — Z0000 Encounter for general adult medical examination without abnormal findings: Secondary | ICD-10-CM | POA: Diagnosis not present

## 2023-12-22 DIAGNOSIS — K529 Noninfective gastroenteritis and colitis, unspecified: Secondary | ICD-10-CM | POA: Diagnosis not present

## 2023-12-22 DIAGNOSIS — K219 Gastro-esophageal reflux disease without esophagitis: Secondary | ICD-10-CM | POA: Diagnosis not present

## 2024-01-26 DIAGNOSIS — Z85828 Personal history of other malignant neoplasm of skin: Secondary | ICD-10-CM | POA: Diagnosis not present

## 2024-01-26 DIAGNOSIS — L814 Other melanin hyperpigmentation: Secondary | ICD-10-CM | POA: Diagnosis not present

## 2024-01-26 DIAGNOSIS — L570 Actinic keratosis: Secondary | ICD-10-CM | POA: Diagnosis not present

## 2024-01-26 DIAGNOSIS — L821 Other seborrheic keratosis: Secondary | ICD-10-CM | POA: Diagnosis not present

## 2024-02-13 ENCOUNTER — Other Ambulatory Visit: Payer: Self-pay | Admitting: Nurse Practitioner

## 2024-02-13 ENCOUNTER — Encounter: Payer: Self-pay | Admitting: Nurse Practitioner

## 2024-02-13 DIAGNOSIS — H35371 Puckering of macula, right eye: Secondary | ICD-10-CM | POA: Diagnosis not present

## 2024-02-13 DIAGNOSIS — K824 Cholesterolosis of gallbladder: Secondary | ICD-10-CM

## 2024-02-13 DIAGNOSIS — H40023 Open angle with borderline findings, high risk, bilateral: Secondary | ICD-10-CM | POA: Diagnosis not present

## 2024-02-13 DIAGNOSIS — H524 Presbyopia: Secondary | ICD-10-CM | POA: Diagnosis not present

## 2024-02-13 DIAGNOSIS — R1030 Lower abdominal pain, unspecified: Secondary | ICD-10-CM

## 2024-02-13 DIAGNOSIS — H2513 Age-related nuclear cataract, bilateral: Secondary | ICD-10-CM | POA: Diagnosis not present

## 2024-02-27 ENCOUNTER — Ambulatory Visit
Admission: RE | Admit: 2024-02-27 | Discharge: 2024-02-27 | Disposition: A | Discharge status: Home / Self Care | Source: Ambulatory Visit | Attending: Nurse Practitioner | Admitting: Nurse Practitioner

## 2024-02-27 DIAGNOSIS — K824 Cholesterolosis of gallbladder: Secondary | ICD-10-CM

## 2024-02-28 DIAGNOSIS — B351 Tinea unguium: Secondary | ICD-10-CM | POA: Diagnosis not present

## 2024-02-28 DIAGNOSIS — L603 Nail dystrophy: Secondary | ICD-10-CM | POA: Diagnosis not present

## 2024-02-28 DIAGNOSIS — Z79899 Other long term (current) drug therapy: Secondary | ICD-10-CM | POA: Diagnosis not present

## 2024-03-17 DIAGNOSIS — G8929 Other chronic pain: Secondary | ICD-10-CM | POA: Diagnosis not present

## 2024-03-17 DIAGNOSIS — M25562 Pain in left knee: Secondary | ICD-10-CM | POA: Diagnosis not present

## 2024-03-17 DIAGNOSIS — M25561 Pain in right knee: Secondary | ICD-10-CM | POA: Diagnosis not present

## 2024-03-19 DIAGNOSIS — H40023 Open angle with borderline findings, high risk, bilateral: Secondary | ICD-10-CM | POA: Diagnosis not present

## 2024-03-19 DIAGNOSIS — H40053 Ocular hypertension, bilateral: Secondary | ICD-10-CM | POA: Diagnosis not present

## 2024-03-29 DIAGNOSIS — M25561 Pain in right knee: Secondary | ICD-10-CM | POA: Diagnosis not present

## 2024-03-29 DIAGNOSIS — M25562 Pain in left knee: Secondary | ICD-10-CM | POA: Diagnosis not present

## 2024-09-04 DIAGNOSIS — H40023 Open angle with borderline findings, high risk, bilateral: Secondary | ICD-10-CM | POA: Diagnosis not present
